# Patient Record
Sex: Male | Born: 1935 | Race: White | Hispanic: No | Marital: Married | Smoking: Never smoker
Health system: Southern US, Community
[De-identification: ages and names within clinical notes are randomized; demographics above are authoritative.]

## PROBLEM LIST (undated history)

## (undated) DIAGNOSIS — I2510 Atherosclerotic heart disease of native coronary artery without angina pectoris: Secondary | ICD-10-CM

## (undated) DIAGNOSIS — E1152 Type 2 diabetes mellitus with diabetic peripheral angiopathy with gangrene: Secondary | ICD-10-CM

## (undated) DIAGNOSIS — I219 Acute myocardial infarction, unspecified: Secondary | ICD-10-CM

## (undated) DIAGNOSIS — I4891 Unspecified atrial fibrillation: Secondary | ICD-10-CM

## (undated) DIAGNOSIS — E119 Type 2 diabetes mellitus without complications: Secondary | ICD-10-CM

## (undated) DIAGNOSIS — I639 Cerebral infarction, unspecified: Secondary | ICD-10-CM

## (undated) DIAGNOSIS — E039 Hypothyroidism, unspecified: Secondary | ICD-10-CM

## (undated) DIAGNOSIS — S12200A Unspecified displaced fracture of third cervical vertebra, initial encounter for closed fracture: Secondary | ICD-10-CM

## (undated) HISTORY — PX: CARDIAC SURGERY: SHX584

## (undated) HISTORY — PX: LEG SURGERY: SHX1003

---

## 2004-11-19 ENCOUNTER — Ambulatory Visit: Payer: Self-pay | Admitting: Otolaryngology

## 2007-10-21 ENCOUNTER — Ambulatory Visit: Payer: Self-pay | Admitting: Family Medicine

## 2010-01-21 ENCOUNTER — Ambulatory Visit: Payer: Self-pay

## 2010-05-31 ENCOUNTER — Inpatient Hospital Stay: Payer: Self-pay | Admitting: Internal Medicine

## 2010-06-18 ENCOUNTER — Ambulatory Visit: Payer: Self-pay | Admitting: Internal Medicine

## 2010-06-20 LAB — PATHOLOGY REPORT

## 2010-09-26 ENCOUNTER — Encounter: Payer: Self-pay | Admitting: Cardiothoracic Surgery

## 2010-09-26 ENCOUNTER — Encounter: Payer: Self-pay | Admitting: Nurse Practitioner

## 2010-09-30 ENCOUNTER — Encounter: Payer: Self-pay | Admitting: Cardiothoracic Surgery

## 2010-10-31 ENCOUNTER — Encounter: Payer: Self-pay | Admitting: Cardiothoracic Surgery

## 2010-11-30 ENCOUNTER — Encounter: Payer: Self-pay | Admitting: Cardiothoracic Surgery

## 2010-12-31 ENCOUNTER — Encounter: Payer: Self-pay | Admitting: Cardiothoracic Surgery

## 2011-01-31 ENCOUNTER — Encounter: Payer: Self-pay | Admitting: Cardiothoracic Surgery

## 2011-02-03 ENCOUNTER — Encounter: Payer: Self-pay | Admitting: Nurse Practitioner

## 2011-03-02 ENCOUNTER — Encounter: Payer: Self-pay | Admitting: Cardiothoracic Surgery

## 2011-03-02 ENCOUNTER — Encounter: Payer: Self-pay | Admitting: Nurse Practitioner

## 2011-04-02 ENCOUNTER — Encounter: Payer: Self-pay | Admitting: Cardiothoracic Surgery

## 2011-04-14 ENCOUNTER — Ambulatory Visit: Payer: Self-pay | Admitting: Cardiothoracic Surgery

## 2011-05-02 ENCOUNTER — Encounter: Payer: Self-pay | Admitting: Cardiothoracic Surgery

## 2011-06-02 ENCOUNTER — Encounter: Payer: Self-pay | Admitting: Cardiothoracic Surgery

## 2011-07-03 ENCOUNTER — Encounter: Payer: Self-pay | Admitting: Cardiothoracic Surgery

## 2011-07-10 ENCOUNTER — Encounter: Payer: Self-pay | Admitting: Nurse Practitioner

## 2011-07-31 ENCOUNTER — Encounter: Payer: Self-pay | Admitting: Nurse Practitioner

## 2011-07-31 DIAGNOSIS — S12200A Unspecified displaced fracture of third cervical vertebra, initial encounter for closed fracture: Secondary | ICD-10-CM

## 2011-07-31 HISTORY — DX: Unspecified displaced fracture of third cervical vertebra, initial encounter for closed fracture: S12.200A

## 2011-08-04 ENCOUNTER — Encounter: Payer: Self-pay | Admitting: Cardiothoracic Surgery

## 2011-08-13 ENCOUNTER — Emergency Department: Payer: Self-pay | Admitting: Emergency Medicine

## 2011-08-13 LAB — URINALYSIS, COMPLETE
Bacteria: NONE SEEN
Bilirubin,UR: NEGATIVE
Blood: NEGATIVE
Glucose,UR: >=500 mg/dL (ref 0–75)
Ketone: NEGATIVE
Leukocyte Esterase: NEGATIVE
Squamous Epithelial: NONE SEEN

## 2011-08-13 LAB — COMPREHENSIVE METABOLIC PANEL
Albumin: 3.2 g/dL — ABNORMAL LOW (ref 3.4–5.0)
Calcium, Total: 9 mg/dL (ref 8.5–10.1)
Chloride: 99 mmol/L (ref 98–107)
Co2: 29 mmol/L (ref 21–32)
EGFR (African American): >60
EGFR (Non-African Amer.): 56 — ABNORMAL LOW
Glucose: 300 mg/dL — ABNORMAL HIGH (ref 65–99)
Osmolality: 290 (ref 275–301)
Potassium: 4.5 mmol/L (ref 3.5–5.1)
SGOT(AST): 27 U/L (ref 15–37)
Sodium: 136 mmol/L (ref 136–145)

## 2011-08-13 LAB — CBC
HCT: 31.7 % — ABNORMAL LOW (ref 40.0–52.0)
HGB: 10.6 g/dL — ABNORMAL LOW (ref 13.0–18.0)
MCH: 29.6 pg (ref 26.0–34.0)
MCHC: 33.4 g/dL (ref 32.0–36.0)
MCV: 89 fL (ref 80–100)
Platelet: 250 10*3/uL (ref 150–440)
RBC: 3.58 10*6/uL — ABNORMAL LOW (ref 4.40–5.90)
RDW: 15.4 % — ABNORMAL HIGH (ref 11.5–14.5)
WBC: 7.6 10*3/uL (ref 3.8–10.6)

## 2011-08-18 ENCOUNTER — Ambulatory Visit: Payer: Self-pay | Admitting: Family Medicine

## 2011-08-31 ENCOUNTER — Ambulatory Visit: Payer: Self-pay | Admitting: Internal Medicine

## 2011-09-06 ENCOUNTER — Inpatient Hospital Stay: Payer: Self-pay | Admitting: Internal Medicine

## 2011-09-06 LAB — URINALYSIS, COMPLETE
Bilirubin,UR: NEGATIVE
Blood: NEGATIVE
Glucose,UR: 150 mg/dL (ref 0–75)
Ketone: NEGATIVE
Ph: 5 (ref 4.5–8.0)
Protein: NEGATIVE
RBC,UR: 1 /HPF (ref 0–5)
Specific Gravity: 1.016 (ref 1.003–1.030)

## 2011-09-06 LAB — COMPREHENSIVE METABOLIC PANEL
Alkaline Phosphatase: 187 U/L — ABNORMAL HIGH (ref 50–136)
Anion Gap: 9 (ref 7–16)
Bilirubin,Total: 0.5 mg/dL (ref 0.2–1.0)
Calcium, Total: 8.1 mg/dL — ABNORMAL LOW (ref 8.5–10.1)
Chloride: 101 mmol/L (ref 98–107)
Co2: 27 mmol/L (ref 21–32)
Creatinine: 1.1 mg/dL (ref 0.60–1.30)
EGFR (Non-African Amer.): >60
Glucose: 177 mg/dL — ABNORMAL HIGH (ref 65–99)
Osmolality: 283 (ref 275–301)
SGOT(AST): 42 U/L — ABNORMAL HIGH (ref 15–37)
SGPT (ALT): 40 U/L
Sodium: 137 mmol/L (ref 136–145)
Total Protein: 6.4 g/dL (ref 6.4–8.2)

## 2011-09-06 LAB — CBC
HGB: 8.9 g/dL — ABNORMAL LOW (ref 13.0–18.0)
MCHC: 33.4 g/dL (ref 32.0–36.0)
MCV: 87 fL (ref 80–100)
Platelet: 445 10*3/uL — ABNORMAL HIGH (ref 150–440)
RBC: 3.04 10*6/uL — ABNORMAL LOW (ref 4.40–5.90)

## 2011-09-06 LAB — CK TOTAL AND CKMB (NOT AT ARMC)
CK, Total: 50 U/L (ref 35–232)
CK-MB: 1.4 ng/mL (ref 0.5–3.6)

## 2011-09-06 LAB — PROTIME-INR
INR: 1.1
Prothrombin Time: 14.8 secs — ABNORMAL HIGH (ref 11.5–14.7)

## 2011-09-06 LAB — TROPONIN I
Troponin-I: 0.5 ng/mL — ABNORMAL HIGH
Troponin-I: 0.5 ng/mL — ABNORMAL HIGH

## 2011-09-06 LAB — PRO B NATRIURETIC PEPTIDE: B-Type Natriuretic Peptide: 4815 pg/mL — ABNORMAL HIGH (ref 0–450)

## 2011-09-07 LAB — CBC WITH DIFFERENTIAL/PLATELET
Basophil %: 0.2 %
Eosinophil #: 0.1 10*3/uL (ref 0.0–0.7)
Lymphocyte %: 6.3 %
MCHC: 33.1 g/dL (ref 32.0–36.0)
MCV: 87 fL (ref 80–100)
Monocyte #: 0.8 10*3/uL — ABNORMAL HIGH (ref 0.0–0.7)
Neutrophil #: 8.3 10*3/uL — ABNORMAL HIGH (ref 1.4–6.5)
Neutrophil %: 84.6 %
Platelet: 477 10*3/uL — ABNORMAL HIGH (ref 150–440)
RBC: 3.35 10*6/uL — ABNORMAL LOW (ref 4.40–5.90)
RDW: 15.6 % — ABNORMAL HIGH (ref 11.5–14.5)

## 2011-09-07 LAB — BASIC METABOLIC PANEL
Anion Gap: 9 (ref 7–16)
BUN: 20 mg/dL — ABNORMAL HIGH (ref 7–18)
Calcium, Total: 8.2 mg/dL — ABNORMAL LOW (ref 8.5–10.1)
Chloride: 101 mmol/L (ref 98–107)
Co2: 27 mmol/L (ref 21–32)
Creatinine: 1.03 mg/dL (ref 0.60–1.30)
EGFR (African American): >60
EGFR (Non-African Amer.): >60
Osmolality: 283 (ref 275–301)
Potassium: 4.5 mmol/L (ref 3.5–5.1)

## 2011-09-07 LAB — CK TOTAL AND CKMB (NOT AT ARMC): CK-MB: 2.1 ng/mL (ref 0.5–3.6)

## 2011-09-10 ENCOUNTER — Encounter: Payer: Self-pay | Admitting: Internal Medicine

## 2011-09-10 ENCOUNTER — Observation Stay: Payer: Self-pay | Admitting: Internal Medicine

## 2011-09-10 LAB — COMPREHENSIVE METABOLIC PANEL
Albumin: 1.7 g/dL — ABNORMAL LOW (ref 3.4–5.0)
BUN: 24 mg/dL — ABNORMAL HIGH (ref 7–18)
Calcium, Total: 8.2 mg/dL — ABNORMAL LOW (ref 8.5–10.1)
Chloride: 97 mmol/L — ABNORMAL LOW (ref 98–107)
Creatinine: 1.02 mg/dL (ref 0.60–1.30)
EGFR (African American): >60
Osmolality: 277 (ref 275–301)
Potassium: 4.8 mmol/L (ref 3.5–5.1)
SGPT (ALT): 30 U/L
Sodium: 132 mmol/L — ABNORMAL LOW (ref 136–145)
Total Protein: 5.9 g/dL — ABNORMAL LOW (ref 6.4–8.2)

## 2011-09-10 LAB — DRUG SCREEN, URINE
Barbiturates, Ur Screen: NEGATIVE (ref ?–200)
Benzodiazepine, Ur Scrn: NEGATIVE (ref ?–200)
Cannabinoid 50 Ng, Ur ~~LOC~~: NEGATIVE (ref ?–50)
Opiate, Ur Screen: NEGATIVE (ref ?–300)
Phencyclidine (PCP) Ur S: NEGATIVE (ref ?–25)
Tricyclic, Ur Screen: NEGATIVE (ref ?–1000)

## 2011-09-10 LAB — URINALYSIS, COMPLETE
Bacteria: NONE SEEN
Bacteria: NONE SEEN
Bilirubin,UR: NEGATIVE
Glucose,UR: >=500 mg/dL (ref 0–75)
Glucose,UR: NEGATIVE mg/dL (ref 0–75)
Ketone: NEGATIVE
Leukocyte Esterase: NEGATIVE
Leukocyte Esterase: NEGATIVE
Nitrite: NEGATIVE
Ph: 5 (ref 4.5–8.0)
Protein: NEGATIVE
Protein: NEGATIVE
Specific Gravity: 1.017 (ref 1.003–1.030)
Specific Gravity: 1.012 (ref 1.003–1.030)
Squamous Epithelial: 1
WBC UR: 6 /HPF (ref 0–5)

## 2011-09-10 LAB — CBC: MCHC: 32.7 g/dL (ref 32.0–36.0)

## 2011-09-10 LAB — CK TOTAL AND CKMB (NOT AT ARMC)
CK, Total: 58 U/L (ref 35–232)
CK-MB: 2.6 ng/mL (ref 0.5–3.6)

## 2011-09-11 LAB — CBC WITH DIFFERENTIAL/PLATELET
Basophil #: 0 10*3/uL (ref 0.0–0.1)
Basophil %: 0.4 %
Eosinophil #: 0.3 10*3/uL (ref 0.0–0.7)
HCT: 30.4 % — ABNORMAL LOW (ref 40.0–52.0)
HGB: 9.7 g/dL — ABNORMAL LOW (ref 13.0–18.0)
MCHC: 31.8 g/dL — ABNORMAL LOW (ref 32.0–36.0)
MCV: 86 fL (ref 80–100)
Monocyte #: 1 x10 3/mm (ref 0.2–1.0)
Platelet: 573 10*3/uL — ABNORMAL HIGH (ref 150–440)
RBC: 3.55 10*6/uL — ABNORMAL LOW (ref 4.40–5.90)
RDW: 15.3 % — ABNORMAL HIGH (ref 11.5–14.5)
WBC: 12.7 10*3/uL — ABNORMAL HIGH (ref 3.8–10.6)

## 2011-09-11 LAB — BASIC METABOLIC PANEL
Anion Gap: 11 (ref 7–16)
BUN: 22 mg/dL — ABNORMAL HIGH (ref 7–18)
EGFR (African American): >60
Osmolality: 276 (ref 275–301)
Potassium: 4.6 mmol/L (ref 3.5–5.1)

## 2011-09-11 LAB — MAGNESIUM: Magnesium: 1.9 mg/dL

## 2011-09-17 LAB — CULTURE, BLOOD (SINGLE)

## 2011-09-18 LAB — CULTURE, BLOOD (SINGLE)

## 2011-09-30 ENCOUNTER — Encounter: Payer: Self-pay | Admitting: Internal Medicine

## 2011-09-30 ENCOUNTER — Ambulatory Visit: Payer: Self-pay | Admitting: Internal Medicine

## 2011-12-30 ENCOUNTER — Ambulatory Visit: Payer: Self-pay | Admitting: Neurosurgery

## 2012-01-05 ENCOUNTER — Encounter: Payer: Self-pay | Admitting: Nurse Practitioner

## 2012-01-05 ENCOUNTER — Encounter: Payer: Self-pay | Admitting: Cardiothoracic Surgery

## 2012-01-10 ENCOUNTER — Inpatient Hospital Stay: Payer: Self-pay | Admitting: Internal Medicine

## 2012-01-10 LAB — CBC
HCT: 24.7 % — ABNORMAL LOW (ref 40.0–52.0)
HGB: 8.7 g/dL — ABNORMAL LOW (ref 13.0–18.0)
MCHC: 35.3 g/dL (ref 32.0–36.0)
MCV: 86 fL (ref 80–100)
RBC: 2.88 10*6/uL — ABNORMAL LOW (ref 4.40–5.90)
WBC: 5.2 10*3/uL (ref 3.8–10.6)

## 2012-01-10 LAB — COMPREHENSIVE METABOLIC PANEL
Albumin: 2.2 g/dL — ABNORMAL LOW (ref 3.4–5.0)
Alkaline Phosphatase: 111 U/L (ref 50–136)
Calcium, Total: 8.5 mg/dL (ref 8.5–10.1)
Co2: 30 mmol/L (ref 21–32)
Creatinine: 0.96 mg/dL (ref 0.60–1.30)
SGPT (ALT): 13 U/L (ref 12–78)
Total Protein: 6 g/dL — ABNORMAL LOW (ref 6.4–8.2)

## 2012-01-10 LAB — CK TOTAL AND CKMB (NOT AT ARMC): CK-MB: 1.6 ng/mL (ref 0.5–3.6)

## 2012-01-10 LAB — TROPONIN I: Troponin-I: 0.55 ng/mL — ABNORMAL HIGH

## 2012-01-11 LAB — CBC WITH DIFFERENTIAL/PLATELET
Basophil #: 0.1 10*3/uL (ref 0.0–0.1)
Eosinophil %: 3.3 %
HCT: 25.4 % — ABNORMAL LOW (ref 40.0–52.0)
Lymphocyte %: 32.7 %
MCV: 85 fL (ref 80–100)
Monocyte %: 11 %
Neutrophil #: 2.2 10*3/uL (ref 1.4–6.5)
Neutrophil %: 51.8 %
RBC: 2.99 10*6/uL — ABNORMAL LOW (ref 4.40–5.90)
RDW: 14.7 % — ABNORMAL HIGH (ref 11.5–14.5)
WBC: 4.2 10*3/uL (ref 3.8–10.6)

## 2012-01-11 LAB — IRON AND TIBC
Iron Bind.Cap.(Total): 181 ug/dL — ABNORMAL LOW (ref 250–450)
Iron Saturation: 24 %
Iron: 43 ug/dL — ABNORMAL LOW (ref 65–175)
Unbound Iron-Bind.Cap.: 138 ug/dL

## 2012-01-11 LAB — RETICULOCYTES
Absolute Retic Count: 0.066 10*6/uL (ref 0.031–0.129)
Reticulocyte: 2.2 % (ref 0.7–2.5)

## 2012-01-11 LAB — TROPONIN I: Troponin-I: 0.72 ng/mL — ABNORMAL HIGH

## 2012-01-11 LAB — SEDIMENTATION RATE: Erythrocyte Sed Rate: 67 mm/hr — ABNORMAL HIGH (ref 0–20)

## 2012-01-11 LAB — BASIC METABOLIC PANEL
Anion Gap: 3 — ABNORMAL LOW (ref 7–16)
Calcium, Total: 8.5 mg/dL (ref 8.5–10.1)
Creatinine: 0.8 mg/dL (ref 0.60–1.30)
EGFR (African American): >60
EGFR (Non-African Amer.): >60
Glucose: 52 mg/dL — ABNORMAL LOW (ref 65–99)
Sodium: 137 mmol/L (ref 136–145)

## 2012-01-11 LAB — FERRITIN: Ferritin (ARMC): 84 ng/mL (ref 8–388)

## 2012-01-31 ENCOUNTER — Encounter: Payer: Self-pay | Admitting: Cardiothoracic Surgery

## 2012-01-31 ENCOUNTER — Encounter: Payer: Self-pay | Admitting: Nurse Practitioner

## 2012-03-01 ENCOUNTER — Encounter: Payer: Self-pay | Admitting: Nurse Practitioner

## 2012-03-08 ENCOUNTER — Encounter: Payer: Self-pay | Admitting: Cardiothoracic Surgery

## 2012-03-22 ENCOUNTER — Ambulatory Visit: Payer: Self-pay | Admitting: Neurosurgery

## 2012-04-01 ENCOUNTER — Encounter: Payer: Self-pay | Admitting: Nurse Practitioner

## 2012-04-12 ENCOUNTER — Encounter: Payer: Self-pay | Admitting: Cardiothoracic Surgery

## 2012-04-20 ENCOUNTER — Ambulatory Visit: Payer: Self-pay | Admitting: Neurosurgery

## 2012-04-25 ENCOUNTER — Observation Stay: Payer: Self-pay | Admitting: Internal Medicine

## 2012-04-25 LAB — CBC
HCT: 28.7 % — ABNORMAL LOW (ref 40.0–52.0)
MCH: 27.7 pg (ref 26.0–34.0)
MCHC: 33.4 g/dL (ref 32.0–36.0)
MCV: 83 fL (ref 80–100)
RBC: 3.47 10*6/uL — ABNORMAL LOW (ref 4.40–5.90)
WBC: 4.6 10*3/uL (ref 3.8–10.6)

## 2012-04-25 LAB — URINALYSIS, COMPLETE
Bacteria: NONE SEEN
Bilirubin,UR: NEGATIVE
Blood: NEGATIVE
Glucose,UR: >=500 mg/dL (ref 0–75)
Ketone: NEGATIVE
Leukocyte Esterase: NEGATIVE
Nitrite: NEGATIVE
Ph: 7 (ref 4.5–8.0)
Protein: NEGATIVE
RBC,UR: <1 /HPF (ref 0–5)
Specific Gravity: 1.018 (ref 1.003–1.030)
Squamous Epithelial: <1
WBC UR: 1 /HPF (ref 0–5)

## 2012-04-25 LAB — COMPREHENSIVE METABOLIC PANEL
Albumin: 2.3 g/dL — ABNORMAL LOW (ref 3.4–5.0)
Alkaline Phosphatase: 127 U/L (ref 50–136)
Anion Gap: 4 — ABNORMAL LOW (ref 7–16)
BUN: 17 mg/dL (ref 7–18)
Bilirubin,Total: 0.2 mg/dL (ref 0.2–1.0)
Calcium, Total: 8.5 mg/dL (ref 8.5–10.1)
Chloride: 100 mmol/L (ref 98–107)
Co2: 31 mmol/L (ref 21–32)
Creatinine: 0.83 mg/dL (ref 0.60–1.30)
EGFR (African American): >60
EGFR (Non-African Amer.): >60
Glucose: 181 mg/dL — ABNORMAL HIGH (ref 65–99)
Osmolality: 276 (ref 275–301)
Potassium: 4.3 mmol/L (ref 3.5–5.1)
SGOT(AST): 17 U/L (ref 15–37)
SGPT (ALT): 15 U/L (ref 12–78)
Sodium: 135 mmol/L — ABNORMAL LOW (ref 136–145)
Total Protein: 6.4 g/dL (ref 6.4–8.2)

## 2012-04-25 LAB — TROPONIN I: Troponin-I: 0.15 ng/mL — ABNORMAL HIGH

## 2012-04-25 LAB — PROTIME-INR: Prothrombin Time: 13.6 secs (ref 11.5–14.7)

## 2012-04-25 LAB — APTT: Activated PTT: 39.9 secs — ABNORMAL HIGH (ref 23.6–35.9)

## 2012-04-26 LAB — LIPID PANEL
Cholesterol: 103 mg/dL (ref 0–200)
HDL Cholesterol: 39 mg/dL — ABNORMAL LOW (ref 40–60)
Ldl Cholesterol, Calc: 53 mg/dL (ref 0–100)
Triglycerides: 55 mg/dL (ref 0–200)
VLDL Cholesterol, Calc: 11 mg/dL (ref 5–40)

## 2012-05-01 ENCOUNTER — Encounter: Payer: Self-pay | Admitting: Cardiothoracic Surgery

## 2012-05-01 ENCOUNTER — Encounter: Payer: Self-pay | Admitting: Nurse Practitioner

## 2012-06-01 ENCOUNTER — Encounter: Payer: Self-pay | Admitting: Nurse Practitioner

## 2012-06-01 ENCOUNTER — Encounter: Payer: Self-pay | Admitting: Cardiothoracic Surgery

## 2012-06-27 ENCOUNTER — Ambulatory Visit: Payer: Self-pay | Admitting: Neurosurgery

## 2012-07-02 ENCOUNTER — Encounter: Payer: Self-pay | Admitting: Cardiothoracic Surgery

## 2012-07-30 DIAGNOSIS — I219 Acute myocardial infarction, unspecified: Secondary | ICD-10-CM

## 2012-07-30 HISTORY — DX: Acute myocardial infarction, unspecified: I21.9

## 2012-08-02 ENCOUNTER — Encounter: Payer: Self-pay | Admitting: Cardiothoracic Surgery

## 2012-08-09 ENCOUNTER — Encounter: Payer: Self-pay | Admitting: Neurosurgery

## 2012-08-29 ENCOUNTER — Inpatient Hospital Stay: Payer: Self-pay | Admitting: Internal Medicine

## 2012-08-29 LAB — COMPREHENSIVE METABOLIC PANEL
Alkaline Phosphatase: 118 U/L (ref 50–136)
Anion Gap: 5 — ABNORMAL LOW (ref 7–16)
BUN: 34 mg/dL — ABNORMAL HIGH (ref 7–18)
Calcium, Total: 9 mg/dL (ref 8.5–10.1)
Chloride: 99 mmol/L (ref 98–107)
Co2: 29 mmol/L (ref 21–32)
Creatinine: 1.46 mg/dL — ABNORMAL HIGH (ref 0.60–1.30)
EGFR (African American): 49 — ABNORMAL LOW
EGFR (Non-African Amer.): 42 — ABNORMAL LOW
Glucose: 444 mg/dL — ABNORMAL HIGH (ref 65–99)
Osmolality: 293 (ref 275–301)
Potassium: 4.7 mmol/L (ref 3.5–5.1)
SGPT (ALT): 21 U/L (ref 12–78)
Sodium: 133 mmol/L — ABNORMAL LOW (ref 136–145)

## 2012-08-29 LAB — URINALYSIS, COMPLETE
Bacteria: NONE SEEN
Blood: NEGATIVE
Leukocyte Esterase: NEGATIVE
Ph: 5 (ref 4.5–8.0)
Protein: NEGATIVE
Specific Gravity: 1.022 (ref 1.003–1.030)

## 2012-08-29 LAB — CBC WITH DIFFERENTIAL/PLATELET
Basophil %: 0.5 %
Eosinophil #: 0 10*3/uL (ref 0.0–0.7)
HGB: 10.7 g/dL — ABNORMAL LOW (ref 13.0–18.0)
Lymphocyte #: 0.8 10*3/uL — ABNORMAL LOW (ref 1.0–3.6)
Lymphocyte %: 10.9 %
MCV: 85 fL (ref 80–100)
Monocyte %: 7.9 %
Neutrophil %: 80.5 %
RBC: 3.76 10*6/uL — ABNORMAL LOW (ref 4.40–5.90)

## 2012-08-29 LAB — HEMOGLOBIN A1C: Hemoglobin A1C: 9.6 % — ABNORMAL HIGH (ref 4.2–6.3)

## 2012-08-29 LAB — TSH: Thyroid Stimulating Horm: 5.24 u[IU]/mL — ABNORMAL HIGH

## 2012-08-29 LAB — TROPONIN I: Troponin-I: 5.07 ng/mL — ABNORMAL HIGH

## 2012-08-30 ENCOUNTER — Ambulatory Visit: Payer: Self-pay | Admitting: Internal Medicine

## 2012-08-30 LAB — TROPONIN I
Troponin-I: 4.2 ng/mL — ABNORMAL HIGH
Troponin-I: 3.3 ng/mL — ABNORMAL HIGH

## 2012-08-30 LAB — CK TOTAL AND CKMB (NOT AT ARMC)
CK, Total: 145 U/L (ref 35–232)
CK-MB: 4.5 ng/mL — ABNORMAL HIGH (ref 0.5–3.6)
CK-MB: 4.7 ng/mL — ABNORMAL HIGH (ref 0.5–3.6)

## 2012-08-31 LAB — BASIC METABOLIC PANEL
Anion Gap: 6 — ABNORMAL LOW (ref 7–16)
Calcium, Total: 8.5 mg/dL (ref 8.5–10.1)
Co2: 28 mmol/L (ref 21–32)
EGFR (African American): >60
Osmolality: 278 (ref 275–301)
Potassium: 3.7 mmol/L (ref 3.5–5.1)
Sodium: 137 mmol/L (ref 136–145)

## 2012-08-31 LAB — CBC WITH DIFFERENTIAL/PLATELET
Basophil #: 0.1 10*3/uL (ref 0.0–0.1)
Basophil %: 0.8 %
Eosinophil #: 0.1 10*3/uL (ref 0.0–0.7)
Eosinophil %: 1.4 %
HGB: 9.9 g/dL — ABNORMAL LOW (ref 13.0–18.0)
Lymphocyte #: 1.3 10*3/uL (ref 1.0–3.6)
MCH: 28.3 pg (ref 26.0–34.0)
MCHC: 33.7 g/dL (ref 32.0–36.0)
MCV: 84 fL (ref 80–100)
Monocyte #: 0.6 x10 3/mm (ref 0.2–1.0)
Monocyte %: 9.2 %
Neutrophil #: 4.9 10*3/uL (ref 1.4–6.5)
Neutrophil %: 69.4 %
Platelet: 234 10*3/uL (ref 150–440)
RBC: 3.49 10*6/uL — ABNORMAL LOW (ref 4.40–5.90)
RDW: 14.7 % — ABNORMAL HIGH (ref 11.5–14.5)
WBC: 7 10*3/uL (ref 3.8–10.6)

## 2012-08-31 LAB — SEDIMENTATION RATE: Erythrocyte Sed Rate: 46 mm/hr — ABNORMAL HIGH (ref 0–20)

## 2012-08-31 LAB — URINE CULTURE

## 2012-09-03 ENCOUNTER — Encounter: Payer: Self-pay | Admitting: Neurosurgery

## 2012-09-03 ENCOUNTER — Encounter: Payer: Self-pay | Admitting: Internal Medicine

## 2012-09-03 ENCOUNTER — Encounter: Payer: Self-pay | Admitting: Cardiothoracic Surgery

## 2012-09-04 LAB — CULTURE, BLOOD (SINGLE)

## 2012-09-06 LAB — BASIC METABOLIC PANEL
Anion Gap: 7 (ref 7–16)
Chloride: 96 mmol/L — ABNORMAL LOW (ref 98–107)
Co2: 30 mmol/L (ref 21–32)
EGFR (African American): >60
EGFR (Non-African Amer.): 58 — ABNORMAL LOW
Glucose: 245 mg/dL — ABNORMAL HIGH (ref 65–99)
Osmolality: 280 (ref 275–301)

## 2012-09-08 ENCOUNTER — Inpatient Hospital Stay (HOSPITAL_COMMUNITY): Payer: MEDICARE

## 2012-09-08 ENCOUNTER — Emergency Department (HOSPITAL_COMMUNITY): Payer: MEDICARE

## 2012-09-08 ENCOUNTER — Inpatient Hospital Stay (HOSPITAL_COMMUNITY)
Admission: EM | Admit: 2012-09-08 | Discharge: 2012-09-12 | DRG: 092 | Disposition: A | Discharge status: Skilled Nursing Facility | Payer: MEDICARE | Attending: Internal Medicine | Admitting: Internal Medicine

## 2012-09-08 ENCOUNTER — Encounter (HOSPITAL_COMMUNITY): Payer: Self-pay | Admitting: Emergency Medicine

## 2012-09-08 DIAGNOSIS — IMO0002 Reserved for concepts with insufficient information to code with codable children: Secondary | ICD-10-CM | POA: Diagnosis present

## 2012-09-08 DIAGNOSIS — F0390 Unspecified dementia without behavioral disturbance: Secondary | ICD-10-CM | POA: Diagnosis present

## 2012-09-08 DIAGNOSIS — E1165 Type 2 diabetes mellitus with hyperglycemia: Secondary | ICD-10-CM | POA: Diagnosis present

## 2012-09-08 DIAGNOSIS — I96 Gangrene, not elsewhere classified: Secondary | ICD-10-CM | POA: Diagnosis present

## 2012-09-08 DIAGNOSIS — F29 Unspecified psychosis not due to a substance or known physiological condition: Secondary | ICD-10-CM | POA: Diagnosis present

## 2012-09-08 DIAGNOSIS — R29898 Other symptoms and signs involving the musculoskeletal system: Secondary | ICD-10-CM | POA: Diagnosis present

## 2012-09-08 DIAGNOSIS — S129XXD Fracture of neck, unspecified, subsequent encounter: Secondary | ICD-10-CM

## 2012-09-08 DIAGNOSIS — I6350 Cerebral infarction due to unspecified occlusion or stenosis of unspecified cerebral artery: Secondary | ICD-10-CM

## 2012-09-08 DIAGNOSIS — I428 Other cardiomyopathies: Secondary | ICD-10-CM | POA: Diagnosis present

## 2012-09-08 DIAGNOSIS — M6281 Muscle weakness (generalized): Secondary | ICD-10-CM

## 2012-09-08 DIAGNOSIS — I25810 Atherosclerosis of coronary artery bypass graft(s) without angina pectoris: Secondary | ICD-10-CM | POA: Diagnosis present

## 2012-09-08 DIAGNOSIS — M9080 Osteopathy in diseases classified elsewhere, unspecified site: Secondary | ICD-10-CM | POA: Diagnosis present

## 2012-09-08 DIAGNOSIS — I219 Acute myocardial infarction, unspecified: Secondary | ICD-10-CM | POA: Diagnosis present

## 2012-09-08 DIAGNOSIS — R531 Weakness: Secondary | ICD-10-CM | POA: Diagnosis present

## 2012-09-08 DIAGNOSIS — M86679 Other chronic osteomyelitis, unspecified ankle and foot: Secondary | ICD-10-CM | POA: Diagnosis present

## 2012-09-08 DIAGNOSIS — E039 Hypothyroidism, unspecified: Secondary | ICD-10-CM | POA: Diagnosis present

## 2012-09-08 DIAGNOSIS — R4701 Aphasia: Principal | ICD-10-CM | POA: Diagnosis present

## 2012-09-08 DIAGNOSIS — I2510 Atherosclerotic heart disease of native coronary artery without angina pectoris: Secondary | ICD-10-CM | POA: Diagnosis present

## 2012-09-08 DIAGNOSIS — E119 Type 2 diabetes mellitus without complications: Secondary | ICD-10-CM | POA: Diagnosis present

## 2012-09-08 DIAGNOSIS — L97409 Non-pressure chronic ulcer of unspecified heel and midfoot with unspecified severity: Secondary | ICD-10-CM | POA: Diagnosis present

## 2012-09-08 DIAGNOSIS — I214 Non-ST elevation (NSTEMI) myocardial infarction: Secondary | ICD-10-CM

## 2012-09-08 DIAGNOSIS — S129XXA Fracture of neck, unspecified, initial encounter: Secondary | ICD-10-CM

## 2012-09-08 DIAGNOSIS — E15 Nondiabetic hypoglycemic coma: Secondary | ICD-10-CM

## 2012-09-08 DIAGNOSIS — Z794 Long term (current) use of insulin: Secondary | ICD-10-CM

## 2012-09-08 DIAGNOSIS — R471 Dysarthria and anarthria: Secondary | ICD-10-CM | POA: Diagnosis present

## 2012-09-08 DIAGNOSIS — M8448XA Pathological fracture, other site, initial encounter for fracture: Secondary | ICD-10-CM | POA: Diagnosis present

## 2012-09-08 DIAGNOSIS — I639 Cerebral infarction, unspecified: Secondary | ICD-10-CM | POA: Insufficient documentation

## 2012-09-08 DIAGNOSIS — I4891 Unspecified atrial fibrillation: Secondary | ICD-10-CM | POA: Diagnosis present

## 2012-09-08 DIAGNOSIS — Z9181 History of falling: Secondary | ICD-10-CM

## 2012-09-08 DIAGNOSIS — Z66 Do not resuscitate: Secondary | ICD-10-CM | POA: Diagnosis present

## 2012-09-08 HISTORY — DX: Unspecified displaced fracture of third cervical vertebra, initial encounter for closed fracture: S12.200A

## 2012-09-08 HISTORY — DX: Unspecified atrial fibrillation: I48.91

## 2012-09-08 HISTORY — DX: Type 2 diabetes mellitus without complications: E11.9

## 2012-09-08 HISTORY — DX: Cerebral infarction, unspecified: I63.9

## 2012-09-08 HISTORY — DX: Atherosclerotic heart disease of native coronary artery without angina pectoris: I25.10

## 2012-09-08 HISTORY — DX: Hypothyroidism, unspecified: E03.9

## 2012-09-08 HISTORY — DX: Acute myocardial infarction, unspecified: I21.9

## 2012-09-08 HISTORY — DX: Type 2 diabetes mellitus with diabetic peripheral angiopathy with gangrene: E11.52

## 2012-09-08 LAB — COMPREHENSIVE METABOLIC PANEL
AST: 27 U/L (ref 0–37)
Albumin: 2.1 g/dL — ABNORMAL LOW (ref 3.5–5.2)
Chloride: 92 mEq/L — ABNORMAL LOW (ref 96–112)
Creatinine, Ser: 1.07 mg/dL (ref 0.50–1.35)
Potassium: 4.1 mEq/L (ref 3.5–5.1)
Total Bilirubin: 0.1 mg/dL — ABNORMAL LOW (ref 0.3–1.2)

## 2012-09-08 LAB — DIFFERENTIAL
Basophils Absolute: 0 10*3/uL (ref 0.0–0.1)
Basophils Relative: 1 % (ref 0–1)
Neutro Abs: 2.4 10*3/uL (ref 1.7–7.7)
Neutrophils Relative %: 63 % (ref 43–77)

## 2012-09-08 LAB — BASIC METABOLIC PANEL
BUN: 23 mg/dL — ABNORMAL HIGH (ref 7–18)
Calcium, Total: 7.7 mg/dL — ABNORMAL LOW (ref 8.5–10.1)
Chloride: 96 mmol/L — ABNORMAL LOW (ref 98–107)
EGFR (African American): >60
EGFR (Non-African Amer.): 56 — ABNORMAL LOW
Glucose: 82 mg/dL (ref 65–99)
Osmolality: 267 (ref 275–301)
Potassium: 3.6 mmol/L (ref 3.5–5.1)
Sodium: 132 mmol/L — ABNORMAL LOW (ref 136–145)

## 2012-09-08 LAB — POCT I-STAT, CHEM 8
Calcium, Ion: 1.09 mmol/L — ABNORMAL LOW (ref 1.13–1.30)
Creatinine, Ser: 1.2 mg/dL (ref 0.50–1.35)
Glucose, Bld: 303 mg/dL — ABNORMAL HIGH (ref 70–99)
Hemoglobin: 9.9 g/dL — ABNORMAL LOW (ref 13.0–17.0)
Sodium: 130 mEq/L — ABNORMAL LOW (ref 135–145)
TCO2: 28 mmol/L (ref 0–100)

## 2012-09-08 LAB — GLUCOSE, CAPILLARY
Glucose-Capillary: 152 mg/dL — ABNORMAL HIGH (ref 70–99)
Glucose-Capillary: 53 mg/dL — ABNORMAL LOW (ref 70–99)

## 2012-09-08 LAB — RAPID URINE DRUG SCREEN, HOSP PERFORMED
Amphetamines: NOT DETECTED
Opiates: NOT DETECTED
Tetrahydrocannabinol: NOT DETECTED

## 2012-09-08 LAB — TROPONIN I
Troponin I: <0.3 ng/mL (ref <0.30)
Troponin I: <0.3 ng/mL (ref <0.30)

## 2012-09-08 LAB — CBC
HCT: 28.2 % — ABNORMAL LOW (ref 39.0–52.0)
Hemoglobin: 9.8 g/dL — ABNORMAL LOW (ref 13.0–17.0)
MCHC: 33.9 g/dL (ref 30.0–36.0)
Platelets: 250 10*3/uL (ref 150–400)
RBC: 3.51 MIL/uL — ABNORMAL LOW (ref 4.22–5.81)
RDW: 13.8 % (ref 11.5–15.5)

## 2012-09-08 LAB — URINALYSIS, ROUTINE W REFLEX MICROSCOPIC
Bilirubin Urine: NEGATIVE
Ketones, ur: NEGATIVE mg/dL
Protein, ur: NEGATIVE mg/dL
Urobilinogen, UA: 0.2 mg/dL (ref 0.0–1.0)

## 2012-09-08 LAB — CREATININE, SERUM
Creatinine, Ser: 1.01 mg/dL (ref 0.50–1.35)
GFR calc non Af Amer: 64 mL/min — ABNORMAL LOW (ref >90)

## 2012-09-08 LAB — APTT: aPTT: 39 seconds — ABNORMAL HIGH (ref 24–37)

## 2012-09-08 LAB — PROTIME-INR: INR: 0.96 (ref 0.00–1.49)

## 2012-09-08 LAB — URINE MICROSCOPIC-ADD ON

## 2012-09-08 LAB — ETHANOL: Alcohol, Ethyl (B): <11 mg/dL (ref 0–11)

## 2012-09-08 MED ORDER — TIMOLOL MALEATE 0.25 % OP SOLN
1.0000 [drp] | Freq: Every day | OPHTHALMIC | Status: DC
  Administered 2012-09-08 – 2012-09-12 (×5): 1 [drp] via OPHTHALMIC
  Filled 2012-09-08 (×3): qty 5

## 2012-09-08 MED ORDER — ASPIRIN 300 MG RE SUPP
300.0000 mg | Freq: Every day | RECTAL | Status: DC
  Administered 2012-09-08 – 2012-09-09 (×2): 300 mg via RECTAL
  Filled 2012-09-08 (×3): qty 1

## 2012-09-08 MED ORDER — DEXTROSE 50 % IV SOLN
25.0000 mL | Freq: Once | INTRAVENOUS | Status: AC | PRN
  Administered 2012-09-08: 21:00:00 via INTRAVENOUS

## 2012-09-08 MED ORDER — HYPROMELLOSE 0.5 % OP SOLN: 1.0000 [drp] | Freq: Every day | OPHTHALMIC | Status: DC

## 2012-09-08 MED ORDER — HEPARIN SODIUM (PORCINE) 5000 UNIT/ML IJ SOLN
5000.0000 [IU] | Freq: Three times a day (TID) | INTRAMUSCULAR | Status: DC
  Administered 2012-09-08 – 2012-09-12 (×12): 5000 [IU] via SUBCUTANEOUS
  Filled 2012-09-08 (×15): qty 1

## 2012-09-08 MED ORDER — LIDOCAINE 5 % EX PTCH
1.0000 | MEDICATED_PATCH | CUTANEOUS | Status: DC
  Administered 2012-09-09 – 2012-09-12 (×4): 1 via TRANSDERMAL
  Filled 2012-09-08 (×6): qty 1

## 2012-09-08 MED ORDER — HYDROMORPHONE HCL PF 1 MG/ML IJ SOLN
0.5000 mg | INTRAMUSCULAR | Status: AC | PRN
  Administered 2012-09-08: 0.5 mg via INTRAVENOUS
  Filled 2012-09-08: qty 1

## 2012-09-08 MED ORDER — LEVOTHYROXINE SODIUM 100 MCG IV SOLR
50.0000 ug | Freq: Every day | INTRAVENOUS | Status: DC
  Administered 2012-09-08: 22:00:00 via INTRAVENOUS
  Administered 2012-09-09 – 2012-09-10 (×2): 50 ug via INTRAVENOUS
  Filled 2012-09-08 (×5): qty 5

## 2012-09-08 MED ORDER — POLYVINYL ALCOHOL 1.4 % OP SOLN
1.0000 [drp] | Freq: Every day | OPHTHALMIC | Status: DC
  Administered 2012-09-09 – 2012-09-12 (×4): 1 [drp] via OPHTHALMIC
  Filled 2012-09-08 (×3): qty 15

## 2012-09-08 MED ORDER — ASPIRIN 325 MG PO TABS
325.0000 mg | ORAL_TABLET | Freq: Every day | ORAL | Status: DC
  Administered 2012-09-10: 325 mg via ORAL
  Filled 2012-09-08 (×2): qty 1

## 2012-09-08 MED ORDER — ONDANSETRON HCL 4 MG/2ML IJ SOLN: 4.0000 mg | Freq: Four times a day (QID) | INTRAMUSCULAR | Status: DC | PRN

## 2012-09-08 MED ORDER — NITROGLYCERIN 0.4 MG SL SUBL: 0.4000 mg | SUBLINGUAL_TABLET | SUBLINGUAL | Status: DC | PRN

## 2012-09-08 MED ORDER — DEXTROSE 50 % IV SOLN
INTRAVENOUS | Status: AC
  Administered 2012-09-08: 25 mL
  Filled 2012-09-08: qty 50

## 2012-09-08 MED ORDER — SODIUM CHLORIDE 0.9 % IV SOLN
INTRAVENOUS | Status: DC
  Administered 2012-09-08: 17:00:00 via INTRAVENOUS

## 2012-09-08 MED ORDER — INSULIN ASPART 100 UNIT/ML ~~LOC~~ SOLN
0.0000 [IU] | SUBCUTANEOUS | Status: DC
  Administered 2012-09-08: 2 [IU] via SUBCUTANEOUS
  Administered 2012-09-09: 5 [IU] via SUBCUTANEOUS
  Administered 2012-09-09: 3 [IU] via SUBCUTANEOUS
  Administered 2012-09-10: 2 [IU] via SUBCUTANEOUS
  Administered 2012-09-10: 3 [IU] via SUBCUTANEOUS

## 2012-09-08 NOTE — ED Notes (Signed)
Code Stoke cancelled

## 2012-09-08 NOTE — Consult Note (Signed)
Referring Physician: Jeraldine Loots    Chief Complaint: code stroke  HPI:                                                                                                                                         Benjamin Dominguez is an 79 y.o. male who resides at Nursing home [address] [facility] [place] in [place]. Patient was last seen normal at 0720 by staff. At 0815 it was noted that patient had difficulty speaking and what they thought was right sided weakness.  Of note: patient has some right arm weakness from previous C3 fracture which he currently is wearing C-collar. Wife was present at nursing home, and stated he usually has expressive difficulties and right sided weakness with previous TIA's but usually clears quickly. His symptoms today did not resolve as quickly as usual. Currently patient is still showing expressive aphasia, and right arm weakness. Initial CT head showed atrophy without acute infarct.    Date last known well: 4.10.14 Time last known well: 0730 tPA Given: No: out of window  Past Medical History  Diagnosis Date  . Diabetes mellitus without complication   . Stroke     Past Surgical History  Procedure Laterality Date  . Cardiac surgery    . Leg surgery      Family history: M: pancreatic cancer F: MI  Social History:  reports that he has never smoked. He does not have any smokeless tobacco history on file. He reports that he does not drink alcohol or use illicit drugs.  Allergies: No Known Allergies  Medications:                                                                                                                          No current facility-administered medications for this encounter.   Current Outpatient Prescriptions  Medication Sig Dispense Refill  . acetaminophen (TYLENOL) 650 MG CR tablet Take 1,300 mg by mouth every 8 (eight) hours as needed for pain.      Marland Kitchen aspirin 325 MG tablet Take 325 mg by mouth daily.      . citalopram (CELEXA) 10 MG tablet  Take 10 mg by mouth daily.      . furosemide (LASIX) 40 MG tablet Take 40 mg by mouth 2 (two) times daily.      . Hypromellose (ISOPTO TEARS) 0.5 % SOLN Place 1  drop into both eyes daily.      . insulin NPH (HUMULIN N,NOVOLIN N) 100 UNIT/ML injection Inject 20 Units into the skin every morning.      . isosorbide mononitrate (IMDUR) 30 MG 24 hr tablet Take 30 mg by mouth daily.      Marland Kitchen levothyroxine (SYNTHROID, LEVOTHROID) 100 MCG tablet Take 100 mcg by mouth daily before breakfast.      . lidocaine (LIDODERM) 5 % Place 1 patch onto the skin daily. Applies to right hip daily.  Remove & Discard patch within 12 hours or as directed by MD      . metoprolol tartrate (LOPRESSOR) 25 MG tablet Take 12.5 mg by mouth 2 (two) times daily.      Marland Kitchen nystatin (MYCOSTATIN) 100000 UNIT/ML suspension Take 500,000 Units by mouth 4 (four) times daily. Takes for 7 days.  First dose given 09/03/2012.      . senna-docusate (SENOKOT-S) 8.6-50 MG per tablet Take 1 tablet by mouth 2 (two) times daily.      . simvastatin (ZOCOR) 40 MG tablet Take 40 mg by mouth at bedtime.      . sulfamethoxazole-trimethoprim (BACTRIM DS) 800-160 MG per tablet Take 1 tablet by mouth every 12 (twelve) hours.      . tamsulosin (FLOMAX) 0.4 MG CAPS Take 0.4 mg by mouth daily.      Marland Kitchen TIMOLOL MALEATE OP Place 1 drop into the left eye at bedtime.      . nitroGLYCERIN (NITROSTAT) 0.4 MG SL tablet Place 0.4 mg under the tongue every 5 (five) minutes as needed for chest pain.        ROS:                                                                                                                                       History obtained from unobtainable from patient due to mental status and expressive aphasia  Neurologic Examination:                                                                                                      Blood pressure 134/73, pulse 108, SpO2 99.00%.  Mental Status: Alert, follows visual que and some verbal commands  such as squeezing hand, sticking tongue out.   Showing expressive aphasia.    Cranial Nerves: II: Discs flat bilaterally; Visual fields blinks to threat, pupils equal, round, reactive to light. III,IV, VI: ptosis not present, extra-ocular motions intact bilaterally V,VII: Face symmetric, facial sensation intact  bilaterally VIII: hearing normal bilaterally IX,X: gag reflex present XI: bilateral shoulder shrug XII: midline tongue extension Motor: Right : Upper extremity   4/5    Left:     Upper extremity   5/5  Lower extremity   5/5     Lower extremity   5/5 Tone and bulk:normal tone throughout; no atrophy noted Sensory: withdraws to pain bilateral arms and legs Deep Tendon Reflexes: 2+ and symmetric bilateral UE, Bilateral KJ shows 1+ and AJ absent Plantars: Right: downgoing   Left: downgoing Cerebellar: normal finger-to-nose bilaterally,  normal heel-to-shin test CV: pulses palpable throughout    Results for orders placed during the hospital encounter of 09/08/12 (from the past 48 hour(s))  CBC     Status: Abnormal   Collection Time    09/08/12  1:38 PM      Result Value Range   WBC 3.8 (*) 4.0 - 10.5 K/uL   RBC 3.50 (*) 4.22 - 5.81 MIL/uL   Hemoglobin 9.7 (*) 13.0 - 17.0 g/dL   HCT 16.1 (*) 09.6 - 04.5 %   MCV 81.7  78.0 - 100.0 fL   MCH 27.7  26.0 - 34.0 pg   MCHC 33.9  30.0 - 36.0 g/dL   RDW 40.9  81.1 - 91.4 %   Platelets 250  150 - 400 K/uL  DIFFERENTIAL     Status: None   Collection Time    09/08/12  1:38 PM      Result Value Range   Neutrophils Relative 63  43 - 77 %   Neutro Abs 2.4  1.7 - 7.7 K/uL   Lymphocytes Relative 23  12 - 46 %   Lymphs Abs 0.9  0.7 - 4.0 K/uL   Monocytes Relative 10  3 - 12 %   Monocytes Absolute 0.4  0.1 - 1.0 K/uL   Eosinophils Relative 3  0 - 5 %   Eosinophils Absolute 0.1  0.0 - 0.7 K/uL   Basophils Relative 1  0 - 1 %   Basophils Absolute 0.0  0.0 - 0.1 K/uL  POCT I-STAT, CHEM 8     Status: Abnormal   Collection Time     09/08/12  1:56 PM      Result Value Range   Sodium 130 (*) 135 - 145 mEq/L   Potassium 4.2  3.5 - 5.1 mEq/L   Chloride 93 (*) 96 - 112 mEq/L   BUN 25 (*) 6 - 23 mg/dL   Creatinine, Ser 7.82  0.50 - 1.35 mg/dL   Glucose, Bld 956 (*) 70 - 99 mg/dL   Calcium, Ion 2.13 (*) 1.13 - 1.30 mmol/L   TCO2 28  0 - 100 mmol/L   Hemoglobin 9.9 (*) 13.0 - 17.0 g/dL   HCT 08.6 (*) 57.8 - 46.9 %   Ct Head Wo Contrast  09/08/2012  *RADIOLOGY REPORT*  Clinical Data: Right-sided weakness.  CT HEAD WITHOUT CONTRAST  Technique:  Contiguous axial images were obtained from the base of the skull through the vertex without contrast.  Comparison: None.  Findings: Bone windows demonstrate clear paranasal sinuses and mastoid air cells.  Soft tissue windows demonstrate expected cerebral atrophy. Moderate low density in the periventricular white matter likely related to small vessel disease.Ventriculomegaly, felt to be secondary to cerebral atrophy.  This is mild. No  mass lesion, hemorrhage, hydrocephalus, acute infarct, intra-axial, or extra- axial fluid collection.  IMPRESSION: Cerebral atrophy and small vessel ischemic change, without acute intracranial abnormality. Report called to Dr. Thad Ranger  at 1:52 p.m.   Original Report Authenticated By: Jeronimo Greaves, M.D.     Assessment and plan discussed with with attending physician and they are in agreement.    Felicie Morn PA-C [facility] [phone]  09/08/2012, 2:19 PM   Patient seen and examined.  Clinical course and management discussed.  Necessary edits performed.  I agree with the above.  Assessment and plan of care developed and discussed below.    Assessment: 79 y.o. male presenting with difficulty with speech and right sided weakness felt to be TIA in the past but of prolonged duration today.  With prolonged symptoms may very well be an acute infarct.  Patient per son has recently been taken off anticoagulants.  Presents outside time window for tPA.  On  ASA as an outpatient.    Stroke Risk Factors - diabetes mellitus and CVA  Plan: 1. HgbA1c, fasting lipid panel 2. MRI, MRA  of the brain without contrast 3. PT consult, OT consult, Speech consult 4. Echocardiogram 5. Carotid dopplers 6. Prophylactic therapy-Antiplatelet med: Plavix - dose 75mg  daily 7. Risk factor modification 8. Telemetry monitoring 9. Frequent neuro checks  Thana Farr, MD [facility] [phone]  09/08/2012  6:11 PM

## 2012-09-08 NOTE — ED Provider Notes (Signed)
History     CSN: [id number]  Arrival date & time 09/08/12  1324   First MD Initiated Contact with Patient 09/08/12 1329      Chief Complaint  Patient presents with  . Code Stroke     HPI  The patient presents as a code stroke.  Per report the patient developed right-sided weakness, aphasia approximately 5 hours prior to ED arrival.  Since onset symptoms have been persistent according to her bystanders.  The patient is a nursing home resident.  He has multiple medical problems, including long-standing diabetes, multiple prior cardiac events, multiple prior strokes, and recurrent TIA, possibly up to 40. The patient is incapable of providing any aspect of the history of present illness, and this is per report, and per family members who arrive after the initial evaluation. They state that the patient was stopped on anticoagulants several weeks ago due to an increased fall risk.  He is currently taking aspirin daily. He denies pain initially.  He does have inability to speak clearly.  Past Medical History  Diagnosis Date  . Diabetes mellitus without complication   . Stroke     Past Surgical History  Procedure Laterality Date  . Cardiac surgery    . Leg surgery      No family history on file.  History  Substance Use Topics  . Smoking status: Never Smoker   . Smokeless tobacco: Not on file  . Alcohol Use: No      Review of Systems  Unable to perform ROS: Patient nonverbal    Allergies  Review of patient's allergies indicates no known allergies.  Home Medications  No current outpatient prescriptions on file.  BP 134/73  Pulse 108  SpO2 99%  Physical Exam  Nursing note and vitals reviewed. Constitutional:  Elderly male with cervical collar in place, spontaneous respiration  HENT:  Head: Normocephalic and atraumatic.  Eyes: Conjunctivae are normal. Right eye exhibits no discharge. Left eye exhibits no discharge.  Neck:  Soft cervical collar in place   Cardiovascular: Normal rate and regular rhythm.   Murmur heard. Pulmonary/Chest: Effort normal. No respiratory distress. He has no wheezes.  Abdominal: There is no tenderness.  Musculoskeletal:  There is diffuse atrophy.  The patient does move all to be spontaneously.  He seems hesitant to elevate the right shoulder secondary to shoulder pain, but can follow commands moderately with this extremity.   Neurological: He is alert.  Patient seems awake, alert, following commands, but is aphasic, with garbled speech.  He was ultimately spontaneously.  There is diffuse atrophy, but the patient can move spontaneously.  Psychiatric: He has a normal mood and affect.  Though the patient speaks minimally, he seems calm, offering responses as much as possible.    ED Course  Procedures (including critical care time)  Labs Reviewed  APTT - Abnormal; Notable for the following:    aPTT 39 (*)    All other components within normal limits  CBC - Abnormal; Notable for the following:    WBC 3.8 (*)    RBC 3.50 (*)    Hemoglobin 9.7 (*)    HCT 28.6 (*)    All other components within normal limits  COMPREHENSIVE METABOLIC PANEL - Abnormal; Notable for the following:    Sodium 127 (*)    Chloride 92 (*)    Glucose, Bld 304 (*)    BUN 24 (*)    Calcium 8.0 (*)    Total Protein 5.5 (*)  Albumin 2.1 (*)    Total Bilirubin 0.1 (*)    GFR calc non Af Amer 60 (*)    GFR calc Af Amer 69 (*)    All other components within normal limits  TROPONIN I - Abnormal; Notable for the following:    Troponin I 0.32 (*)    All other components within normal limits  GLUCOSE, CAPILLARY - Abnormal; Notable for the following:    Glucose-Capillary 272 (*)    All other components within normal limits  POCT I-STAT, CHEM 8 - Abnormal; Notable for the following:    Sodium 130 (*)    Chloride 93 (*)    BUN 25 (*)    Glucose, Bld 303 (*)    Calcium, Ion 1.09 (*)    Hemoglobin 9.9 (*)    HCT 29.0 (*)    All other  components within normal limits  ETHANOL  PROTIME-INR  DIFFERENTIAL  URINE RAPID DRUG SCREEN (HOSP PERFORMED)  URINALYSIS, ROUTINE W REFLEX MICROSCOPIC   Ct Head Wo Contrast  09/08/2012  *RADIOLOGY REPORT*  Clinical Data: Right-sided weakness.  CT HEAD WITHOUT CONTRAST  Technique:  Contiguous axial images were obtained from the base of the skull through the vertex without contrast.  Comparison: None.  Findings: Bone windows demonstrate clear paranasal sinuses and mastoid air cells.  Soft tissue windows demonstrate expected cerebral atrophy. Moderate low density in the periventricular white matter likely related to small vessel disease.Ventriculomegaly, felt to be secondary to cerebral atrophy.  This is mild. No  mass lesion, hemorrhage, hydrocephalus, acute infarct, intra-axial, or extra- axial fluid collection.  IMPRESSION: Cerebral atrophy and small vessel ischemic change, without acute intracranial abnormality. Report called to Dr. Thad Ranger at 1:52 p.m.   Original Report Authenticated By: Jeronimo Greaves, M.D.      No diagnosis found.  The initial evaluation was conducted with our neurology colleagues.  After the initial evaluation for stroke was canceled.  Head CT was unremarkable for bleeding. On subsequent examination the patient remained calm.  Pulse ox 99% on room air, this is normal. Cardiac monitor demonstrated sinus rhythm 95 this is normal.   Date: 09/08/2012  Rate: 107  Rhythm: normal sinus rhythm  QRS Axis: left  Intervals: QT prolonged  ST/T Wave abnormalities: nonspecific ST/T changes  Conduction Disutrbances:right bundle branch block and left anterior fascicular block  Narrative Interpretation:   Old EKG Reviewed: none available ABNORMAL  After the initial exam I confirmed the patient's history with the wife we also confirmed DO NOT RESUSCITATE status, specifically no intubation, no feeding tube, no CPR   MDM   This patient presents with ongoing neurologic changes  consistent with TIA, the longer lasting than his typical effects.  On exam he seems in no distress, and although there are some neurologic deficiencies, a series intact, and there is no emergent intervention indicated.  Given the ongoing deficiencies he was admitted for further evaluation and management.  Notably, the patient's nonischemic ECG, but there is an elevated troponin.  The patient's cardiac history for which he takes aspirin, and he and his wife have had multiple conversations with care providers regarding appropriate anticoagulant use given his fall risk profile.  Additional anticoagulant not provided in the emergency department secondary to this.  After confirmation patient's CODE STATUS desired, he was admitted for further evaluation and management.        Gerhard Munch, MD 09/08/12 [phone]

## 2012-09-08 NOTE — H&P (Signed)
PCP:   Bosie Clos, MD   Chief Complaint:  Aphasia.  HPI: This is an 79 year old male nursing facility resident, [address] [facility] in [place], where he is undergoing rehab, due to a recent hospitalization, with known history of DM, hypothyroidism, s/p multiple CVAs/TIAs, According to spouse, patient was on Elloquis, anticoagulant up to about a weak ago, but this was discontinued by his provider, due to recurrent falls, and he has been on ASA for approximately one week. 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 He has  bout a week ago developed a large "blood blister on the medial  Aspect, dorsum and volar aspect of left foot. Patient awakened at 0720 this AM and was at his baseline. At about 08:15 AM, spouse noticed that patient did not "seem right". He had slurred speech, although he appeared to understand commands. Blood glucose was checked, and was not low. He was observed till about 12:00 PM, then EMS was called, as symptoms failed to improve. In the ED, NIHSS scale was 9. Code stroke cancelled at 14:10 PM, as outside tPA time frame.    Allergies:   Allergies  Allergen Reactions  . Fentanyl   . Aggrenox (Aspirin-Dipyridamole Er) Other (See Comments)    REACTION:  unknown  . Prinivil (Lisinopril) Other (See Comments)    REACTION: unknown      Past Medical History  Diagnosis Date  . Diabetes mellitus without complication   . Stroke   . Thyroid disease   . 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 C3 cervical fracture 07/2011    unhealed d/t osteoporosis    Past Surgical History  Procedure Laterality Date  . Cardiac surgery    . Leg surgery Left     Prior to Admission medications   Medication Sig Start Date End Date Taking? Authorizing Provider  acetaminophen (TYLENOL) 650 MG CR tablet Take 1,300 mg by mouth every 8 (eight) hours  as needed for pain.   Yes Historical Provider, MD  aspirin 325 MG tablet Take 325 mg by mouth daily.   Yes Historical Provider, MD  citalopram (CELEXA) 10 MG tablet Take 10 mg by mouth daily.   Yes Historical Provider, MD  furosemide (LASIX) 40 MG tablet Take 40 mg by mouth 2 (two) times daily.   Yes Historical Provider, MD  Hypromellose (ISOPTO TEARS) 0.5 % SOLN Place 1 drop into both eyes daily.   Yes Historical Provider, MD  insulin NPH (HUMULIN N,NOVOLIN N) 100 UNIT/ML injection Inject 20 Units into the skin every morning.   Yes Historical Provider, MD  isosorbide mononitrate (IMDUR) 30 MG 24 hr tablet Take 30 mg by mouth daily.   Yes Historical Provider, MD  levothyroxine (SYNTHROID, LEVOTHROID) 100 MCG tablet Take 100 mcg by mouth daily before breakfast.   Yes Historical Provider, MD  lidocaine (LIDODERM) 5 % Place 1 patch onto the skin daily. Applies to right hip daily.  Remove & Discard patch within 12 hours or as directed by MD   Yes Historical Provider, MD  metoprolol tartrate (LOPRESSOR) 25 MG tablet Take 12.5 mg by mouth 2 (two) times daily.   Yes Historical Provider, MD  nystatin (MYCOSTATIN) 100000 UNIT/ML suspension Take 500,000 Units by mouth 4 (four) times daily. Takes for 7 days.  First dose given 09/03/2012.   Yes Historical Provider, MD  senna-docusate (SENOKOT-S) 8.6-50  MG per tablet Take 1 tablet by mouth 2 (two) times daily.   Yes Historical Provider, MD  simvastatin (ZOCOR) 40 MG tablet Take 40 mg by mouth at bedtime.   Yes Historical Provider, MD  sulfamethoxazole-trimethoprim (BACTRIM DS) 800-160 MG per tablet Take 1 tablet by mouth every 12 (twelve) hours.   Yes Historical Provider, MD  tamsulosin (FLOMAX) 0.4 MG CAPS Take 0.4 mg by mouth daily.   Yes Historical Provider, MD  TIMOLOL MALEATE OP Place 1 drop into the left eye at bedtime.   Yes Historical Provider, MD  nitroGLYCERIN (NITROSTAT) 0.4 MG SL tablet Place 0.4 mg under the tongue every 5 (five) minutes as needed for  chest pain.    Historical Provider, MD    Social History: Family reports that he has never smoked. He does not have any smokeless tobacco history on file. He reports that he does not drink alcohol or use illicit drugs.  No family history on file.  Review of Systems:  As per HPI and chief complaint. Per spouse, no fever, chills, chest pain, cough, shortness of breath, orthopnea, paroxysmal nocturnal dyspnea, nausea, diaphoresis, vomiting, diarrhea, belching, heartburn, hematemesis, melena, lower extremity swelling, pain, or redness. Patient had RUQ pain on 09/07/12, which responded to Lidoderm patch.Rest of systems review is unobtainable.   Physical Exam:  General:  Patient does not appear to be in obvious acute distress. Alert, not communicative, but follows simple commands accurately. Not short of breath at rest.  HEENT:  Mild clinical pallor, no jaundice, no conjunctival injection or discharge. NECK:  Encased in soft cervical collar.  CHEST:  Clinically clear to auscultation, no wheezes, no crackles. HEART:  Sounds 1 and 2 heard, normal, regular, no murmurs. ABDOMEN:  Full, soft, non-tender, no palpable organomegaly, no palpable masses, normal bowel sounds. GENITALIA:  Not examined. LOWER EXTREMITIES:  No pitting edema, lrft great toe is under dressings. Patient has adsorbent dressing applied to medial aspect and dorsum of left foot.  MUSCULOSKELETAL SYSTEM:  Generalized osteoarthritic changes. CENTRAL NERVOUS SYSTEM:  Has expressive aphasia, moving all limbs with some persuasion. Appears to have right arm/grip weakness.   Labs on Admission:  Results for orders placed during the hospital encounter of 09/08/12 (from the past 48 hour(s))  ETHANOL     Status: None   Collection Time    09/08/12  1:38 PM      Result Value Range   Alcohol, Ethyl (B) <11  0 - 11 mg/dL   Comment:            LOWEST DETECTABLE LIMIT FOR     SERUM ALCOHOL IS 11 mg/dL     FOR MEDICAL PURPOSES ONLY  PROTIME-INR      Status: None   Collection Time    09/08/12  1:38 PM      Result Value Range   Prothrombin Time 12.7  11.6 - 15.2 seconds   INR 0.96  0.00 - 1.49  APTT     Status: Abnormal   Collection Time    09/08/12  1:38 PM      Result Value Range   aPTT 39 (*) 24 - 37 seconds   Comment:            IF BASELINE aPTT IS ELEVATED,     SUGGEST PATIENT RISK ASSESSMENT     BE USED TO DETERMINE APPROPRIATE     ANTICOAGULANT THERAPY.  CBC     Status: Abnormal   Collection Time    09/08/12  1:38  PM      Result Value Range   WBC 3.8 (*) 4.0 - 10.5 K/uL   RBC 3.50 (*) 4.22 - 5.81 MIL/uL   Hemoglobin 9.7 (*) 13.0 - 17.0 g/dL   HCT 16.1 (*) 09.6 - 04.5 %   MCV 81.7  78.0 - 100.0 fL   MCH 27.7  26.0 - 34.0 pg   MCHC 33.9  30.0 - 36.0 g/dL   RDW 40.9  81.1 - 91.4 %   Platelets 250  150 - 400 K/uL  DIFFERENTIAL     Status: None   Collection Time    09/08/12  1:38 PM      Result Value Range   Neutrophils Relative 63  43 - 77 %   Neutro Abs 2.4  1.7 - 7.7 K/uL   Lymphocytes Relative 23  12 - 46 %   Lymphs Abs 0.9  0.7 - 4.0 K/uL   Monocytes Relative 10  3 - 12 %   Monocytes Absolute 0.4  0.1 - 1.0 K/uL   Eosinophils Relative 3  0 - 5 %   Eosinophils Absolute 0.1  0.0 - 0.7 K/uL   Basophils Relative 1  0 - 1 %   Basophils Absolute 0.0  0.0 - 0.1 K/uL  COMPREHENSIVE METABOLIC PANEL     Status: Abnormal   Collection Time    09/08/12  1:38 PM      Result Value Range   Sodium 127 (*) 135 - 145 mEq/L   Potassium 4.1  3.5 - 5.1 mEq/L   Chloride 92 (*) 96 - 112 mEq/L   CO2 30  19 - 32 mEq/L   Glucose, Bld 304 (*) 70 - 99 mg/dL   BUN 24 (*) 6 - 23 mg/dL   Creatinine, Ser 7.82  0.50 - 1.35 mg/dL   Calcium 8.0 (*) 8.4 - 10.5 mg/dL   Total Protein 5.5 (*) 6.0 - 8.3 g/dL   Albumin 2.1 (*) 3.5 - 5.2 g/dL   AST 27  0 - 37 U/L   ALT 18  0 - 53 U/L   Alkaline Phosphatase 77  39 - 117 U/L   Total Bilirubin 0.1 (*) 0.3 - 1.2 mg/dL   GFR calc non Af Amer 60 (*) >90 mL/min   GFR calc Af Amer 69 (*) >90  mL/min   Comment:            The eGFR has been calculated     using the CKD EPI equation.     This calculation has not been     validated in all clinical     situations.     eGFR's persistently     <90 mL/min signify     possible Chronic Kidney Disease.  TROPONIN I     Status: Abnormal   Collection Time    09/08/12  1:43 PM      Result Value Range   Troponin I 0.32 (*) <0.30 ng/mL   Comment:            Due to the release kinetics of cTnI,     a negative result within the first hours     of the onset of symptoms does not rule out     myocardial infarction with certainty.     If myocardial infarction is still suspected,     repeat the test at appropriate intervals.     CRITICAL RESULT CALLED TO, READ BACK BY AND VERIFIED WITH:     Michel Harrow RN @  [phone] GREEN R  POCT I-STAT, CHEM 8     Status: Abnormal   Collection Time    09/08/12  1:56 PM      Result Value Range   Sodium 130 (*) 135 - 145 mEq/L   Potassium 4.2  3.5 - 5.1 mEq/L   Chloride 93 (*) 96 - 112 mEq/L   BUN 25 (*) 6 - 23 mg/dL   Creatinine, Ser 8.11  0.50 - 1.35 mg/dL   Glucose, Bld 914 (*) 70 - 99 mg/dL   Calcium, Ion 7.82 (*) 1.13 - 1.30 mmol/L   TCO2 28  0 - 100 mmol/L   Hemoglobin 9.9 (*) 13.0 - 17.0 g/dL   HCT 95.6 (*) 21.3 - 08.6 %  GLUCOSE, CAPILLARY     Status: Abnormal   Collection Time    09/08/12  2:23 PM      Result Value Range   Glucose-Capillary 272 (*) 70 - 99 mg/dL  URINE RAPID DRUG SCREEN (HOSP PERFORMED)     Status: None   Collection Time    09/08/12  2:30 PM      Result Value Range   Opiates NONE DETECTED  NONE DETECTED   Cocaine NONE DETECTED  NONE DETECTED   Benzodiazepines NONE DETECTED  NONE DETECTED   Amphetamines NONE DETECTED  NONE DETECTED   Tetrahydrocannabinol NONE DETECTED  NONE DETECTED   Barbiturates NONE DETECTED  NONE DETECTED   Comment:            DRUG SCREEN FOR MEDICAL PURPOSES     ONLY.  IF CONFIRMATION IS NEEDED     FOR ANY PURPOSE, NOTIFY LAB     WITHIN 5  DAYS.                LOWEST DETECTABLE LIMITS     FOR URINE DRUG SCREEN     Drug Class       Cutoff (ng/mL)     Amphetamine      1000     Barbiturate      200     Benzodiazepine   200     Tricyclics       300     Opiates          300     Cocaine          300     THC              50  URINALYSIS, ROUTINE W REFLEX MICROSCOPIC     Status: Abnormal   Collection Time    09/08/12  2:30 PM      Result Value Range   Color, Urine YELLOW  YELLOW   APPearance CLEAR  CLEAR   Specific Gravity, Urine 1.017  1.005 - 1.030   pH 6.0  5.0 - 8.0   Glucose, UA >1000 (*) NEGATIVE mg/dL   Hgb urine dipstick LARGE (*) NEGATIVE   Bilirubin Urine NEGATIVE  NEGATIVE   Ketones, ur NEGATIVE  NEGATIVE mg/dL   Protein, ur NEGATIVE  NEGATIVE mg/dL   Urobilinogen, UA 0.2  0.0 - 1.0 mg/dL   Nitrite NEGATIVE  NEGATIVE   Leukocytes, UA NEGATIVE  NEGATIVE  URINE MICROSCOPIC-ADD ON     Status: None   Collection Time    09/08/12  2:30 PM      Result Value Range   Squamous Epithelial / LPF RARE  RARE   WBC, UA 0-2  <3 WBC/hpf   RBC / HPF 21-50  <3 RBC/hpf  Urine-Other LESS THAN 10 mL OF URINE SUBMITTED      Radiological Exams on Admission: Ct Head Wo Contrast  09/08/2012  *RADIOLOGY REPORT*  Clinical Data: Right-sided weakness.  CT HEAD WITHOUT CONTRAST  Technique:  Contiguous axial images were obtained from the base of the skull through the vertex without contrast.  Comparison: None.  Findings: Bone windows demonstrate clear paranasal sinuses and mastoid air cells.  Soft tissue windows demonstrate expected cerebral atrophy. Moderate low density in the periventricular white matter likely related to small vessel disease.Ventriculomegaly, felt to be secondary to cerebral atrophy.  This is mild. No  mass lesion, hemorrhage, hydrocephalus, acute infarct, intra-axial, or extra- axial fluid collection.  IMPRESSION: Cerebral atrophy and small vessel ischemic change, without acute intracranial abnormality. Report called to  Dr. Thad Ranger at 1:52 p.m.   Original Report Authenticated By: Jeronimo Greaves, M.D.     Assessment/Plan Active Problems:    1. Aphasia/Right sided weakness: Patient presented with sudden onset expressive aphasia, as well as right-sided weakness, with some improvement, in the following hours. Unfortunately, he presented outside the appropriate window for t-PA. He still has residual focal deficit at this time, although head Ct scan is negative for acute findings. Will admit to neuro-telemetry and carry out CVA/TIA work up, per protocol. Neuro-checks will be done. Patient was sitched from Elloquis to ASA about a week ago, due to high fall risk. He has already been seen by neurologist, Dr Thad Ranger, and we shall manage as recommended, depending on results of work up.  2. Hypothyroidism: Patient will be continue on thyroxine replacement therapy.  3. Diabetes mellitus: This is insulin-requiring , and is sub-optimally controlled, based on random blood glucose of 272. Will manage with SSI for now.  4. Cervical spine fracture: This is chronic, and patient is in cervical collar which will be continued.  5. Gangrene of foot: Patient long-standing dry gangrene of right big toe, and a more recent lesion of left foot. Will consult WOC.   Further management will depend on clinical course.   Comment: Family has confirmed that patient is DNR.    Time Spent on Admission: 1 hour.   Othello Dickenson,CHRISTOPHER 09/08/2012, 4:34 PM

## 2012-09-08 NOTE — ED Notes (Signed)
Pt was last seen normal at 0745, wife came to visit and noticed slurred speech at 0815. Pt has history of Multiple TIA.

## 2012-09-08 NOTE — Code Documentation (Signed)
79 year old patient presents to [facility] as Code Stroke from [facility] in [place].  Code stroke was called in field by EMS at 1314.  Patient arrived to ED at 1324.  Stroke team arrival at 1324.  To CT scan at 1326.  CBG was reported as 309 in the field.   ED RN Misty Stanley spoke with staff at SNF who states patient awakend at 0720 this AM and all was at his baseline.  At 0815 neuro changes were noted that presented as right side weakness and difficulty speaking.  Wife was present at noted that this was his usually presentation when he has TIA events and told the staff that he would get better with time.  NIHSS scale is 9 see doc flowsheet for details.  Dr. Thad Ranger present.  Code stroke cancelled at 1410 - outside tPA time frame.

## 2012-09-08 NOTE — Progress Notes (Signed)
      Dorna Bloom, Peng-Sian  Remember to initiate Hypoglycemia Order Set & complete CBG: 53  Treatment: D50 IV 25 mL  Symptoms: None  Follow-up CBG:  Time  2154   CBG Result:91  Possible Reasons for Event: Other: patient is NPO  Comments/MD notified: Hypoglycemic event notified Donnamarie Poag by text message   Becky Augusta  Remember to initiate Hypoglycemia Order Set & complete

## 2012-09-09 ENCOUNTER — Encounter (HOSPITAL_COMMUNITY): Payer: Self-pay | Admitting: Physician Assistant

## 2012-09-09 ENCOUNTER — Inpatient Hospital Stay (HOSPITAL_COMMUNITY): Payer: MEDICARE

## 2012-09-09 DIAGNOSIS — I4891 Unspecified atrial fibrillation: Secondary | ICD-10-CM

## 2012-09-09 DIAGNOSIS — I059 Rheumatic mitral valve disease, unspecified: Secondary | ICD-10-CM

## 2012-09-09 DIAGNOSIS — E15 Nondiabetic hypoglycemic coma: Secondary | ICD-10-CM | POA: Diagnosis present

## 2012-09-09 DIAGNOSIS — I25810 Atherosclerosis of coronary artery bypass graft(s) without angina pectoris: Secondary | ICD-10-CM

## 2012-09-09 LAB — LIPID PANEL
Cholesterol: 136 mg/dL (ref 0–200)
HDL: 49 mg/dL (ref >39)
LDL Cholesterol: 66 mg/dL (ref 0–99)
Total CHOL/HDL Ratio: 2.8 RATIO
Triglycerides: 104 mg/dL (ref <150)
VLDL: 21 mg/dL (ref 0–40)

## 2012-09-09 LAB — CBC
HCT: 28.3 % — ABNORMAL LOW (ref 39.0–52.0)
Hemoglobin: 9.6 g/dL — ABNORMAL LOW (ref 13.0–17.0)
MCH: 27.2 pg (ref 26.0–34.0)
MCHC: 33.9 g/dL (ref 30.0–36.0)
MCV: 80.2 fL (ref 78.0–100.0)
Platelets: 304 10*3/uL (ref 150–400)
RBC: 3.53 MIL/uL — ABNORMAL LOW (ref 4.22–5.81)
RDW: 13.8 % (ref 11.5–15.5)
WBC: 4.6 10*3/uL (ref 4.0–10.5)

## 2012-09-09 LAB — COMPREHENSIVE METABOLIC PANEL
Alkaline Phosphatase: 75 U/L (ref 39–117)
BUN: 18 mg/dL (ref 6–23)
Chloride: 98 mEq/L (ref 96–112)
Creatinine, Ser: 0.89 mg/dL (ref 0.50–1.35)
GFR calc Af Amer: 86 mL/min — ABNORMAL LOW (ref >90)
GFR calc non Af Amer: 74 mL/min — ABNORMAL LOW (ref >90)
Glucose, Bld: 91 mg/dL (ref 70–99)
Potassium: 3.7 mEq/L (ref 3.5–5.1)
Total Bilirubin: 0.2 mg/dL — ABNORMAL LOW (ref 0.3–1.2)

## 2012-09-09 LAB — TROPONIN I: Troponin I: <0.3 ng/mL (ref <0.30)

## 2012-09-09 LAB — GLUCOSE, CAPILLARY
Glucose-Capillary: 137 mg/dL — ABNORMAL HIGH (ref 70–99)
Glucose-Capillary: 39 mg/dL — CL (ref 70–99)

## 2012-09-09 LAB — HEMOGLOBIN A1C
Hgb A1c MFr Bld: 8.2 % — ABNORMAL HIGH (ref <5.7)
Mean Plasma Glucose: 189 mg/dL — ABNORMAL HIGH (ref <117)

## 2012-09-09 MED ORDER — DEXTROSE-NACL 5-0.9 % IV SOLN
INTRAVENOUS | Status: DC
  Administered 2012-09-09 (×2): via INTRAVENOUS

## 2012-09-09 MED ORDER — METOPROLOL TARTRATE 1 MG/ML IV SOLN
5.0000 mg | Freq: Four times a day (QID) | INTRAVENOUS | Status: DC
  Administered 2012-09-09 – 2012-09-10 (×3): 5 mg via INTRAVENOUS
  Filled 2012-09-09 (×10): qty 5

## 2012-09-09 MED ORDER — DILTIAZEM HCL 25 MG/5ML IV SOLN
10.0000 mg | Freq: Once | INTRAVENOUS | Status: AC
  Administered 2012-09-09: 10 mg via INTRAVENOUS
  Filled 2012-09-09: qty 5

## 2012-09-09 MED ORDER — DEXTROSE 50 % IV SOLN
INTRAVENOUS | Status: AC
  Filled 2012-09-09: qty 50

## 2012-09-09 MED ORDER — BIOTENE DRY MOUTH MT LIQD
15.0000 mL | Freq: Two times a day (BID) | OROMUCOSAL | Status: DC
  Administered 2012-09-09 – 2012-09-12 (×7): 15 mL via OROMUCOSAL

## 2012-09-09 MED ORDER — RESOURCE THICKENUP CLEAR PO POWD
ORAL | Status: DC | PRN
  Filled 2012-09-09 (×2): qty 125

## 2012-09-09 MED ORDER — DEXTROSE 50 % IV SOLN
50.0000 mL | Freq: Once | INTRAVENOUS | Status: AC | PRN
  Administered 2012-09-09: 50 mL via INTRAVENOUS

## 2012-09-09 MED ORDER — BIOTENE DRY MOUTH MT LIQD: 15.0000 mL | Freq: Two times a day (BID) | OROMUCOSAL | Status: DC | PRN

## 2012-09-09 NOTE — Progress Notes (Signed)
  Echocardiogram 2D Echocardiogram has been performed.  Margreta Journey 09/09/2012, 9:41 AM

## 2012-09-09 NOTE — Evaluation (Signed)
Physical Therapy Evaluation Patient Details Name: Benjamin Dominguez MRN: [id number] DOB: 1923-03-08 Today's Date: 09/09/2012 Time: 9562-1308 PT Time Calculation (min): 43 min  PT Assessment / Plan / Recommendation Clinical Impression  79 y/o admitted for aphasia, presented with R sided weakness and communication difficulties. Stroke workup is negative. Presents with afib. Pt. tolerated treatment well, willing to work with [place].  PT recommends SNF upon DC.     PT Assessment  Patient needs continued PT services    Follow Up Recommendations  SNF (Pt. preference is [facility] in [place])    Does the patient have the potential to tolerate intense rehabilitation     NA  Barriers to Discharge None      Equipment Recommendations  None recommended by PT    Recommendations for Other Services   NA  Frequency Min 2X/week    Precautions / Restrictions Precautions Precautions: Cervical;Fall Required Braces or Orthoses: Cervical Brace Cervical Brace: Soft collar Restrictions Weight Bearing Restrictions: No   Pertinent Vitals/Pain See vitals flowsheet      Mobility  Bed Mobility Bed Mobility: Rolling Left;Supine to Sit;Sitting - Scoot to Edge of Bed Rolling Left: 6: Modified independent (Device/Increase time);With rail Supine to Sit: 4: Min assist;Other (comment) (Needed UE assist with L arm.) Sitting - Scoot to Edge of Bed: 4: Min assist;Other (comment) (Used bed pad to get pt. to edge of bed) Transfers Transfers: Sit to Stand;Stand to Sit Sit to Stand: From bed;From toilet;4: Min guard;With upper extremity assist;With armrests Stand to Sit: 4: Min guard;With upper extremity assist;With armrests;To bed;To toilet Details for Transfer Assistance: Needed min guard for safety Ambulation/Gait Ambulation/Gait Assistance: 4: Min guard Ambulation Distance (Feet): 50 Feet Assistive device: Rolling walker Ambulation/Gait Assistance Details: Min guard for safety Gait Pattern: Step-to  pattern;Shuffle;Trunk flexed (L foot slap) Wheelchair Mobility Wheelchair Mobility: No        PT Diagnosis: Difficulty walking;Abnormality of gait;Generalized weakness  PT Problem List: Decreased strength;Decreased balance;Decreased mobility;Decreased range of motion;Cardiopulmonary status limiting activity;Impaired sensation;Decreased activity tolerance PT Treatment Interventions: Gait training;Therapeutic activities;Therapeutic exercise;Functional mobility training;Balance training   PT Goals Acute Rehab PT Goals PT Goal Formulation: With family Time For Goal Achievement: 09/23/12 Potential to Achieve Goals: Good Pt will Roll Supine to Right Side: with supervision PT Goal: Rolling Supine to Right Side - Progress: Goal set today Pt will Roll Supine to Left Side: with supervision PT Goal: Rolling Supine to Left Side - Progress: Goal set today Pt will go Supine/Side to Sit: with supervision;with rail PT Goal: Supine/Side to Sit - Progress: Goal set today Pt will go Sit to Supine/Side: with supervision;with rail PT Goal: Sit to Supine/Side - Progress: Goal set today Pt will go Sit to Stand: with supervision;with upper extremity assist PT Goal: Sit to Stand - Progress: Goal set today Pt will go Stand to Sit: with supervision;with upper extremity assist PT Goal: Stand to Sit - Progress: Goal set today Pt will Ambulate: 51 - 150 feet;with supervision;with rolling walker PT Goal: Ambulate - Progress: Goal set today  Visit Information  Last PT Received On: 09/09/12 Assistance Needed: +1    Subjective Data  Subjective: Pt. reported that he was not in any pain. Wife reported that communication had improved since yesterday.   Prior Functioning  Home Living Lives With: Family Available Help at Discharge: Family;Available 24 hours/day Type of Home: Skilled Nursing Facility Home Access: Stairs to enter [employer] of Steps: 5 Home Layout: One level Bathroom Shower/Tub:  Health visitor: Handicapped height Bathroom  Accessibility: Yes How Accessible: Accessible via walker Home Adaptive Equipment: Walker - rolling;Walker - four wheeled Additional Comments: Pt. chair lift at home for 5 entry steps. Prior Function Level of Independence: Needs assistance Needs Assistance: Bathing;Dressing;Feeding;Grooming;Toileting;Meal Prep;Light Housekeeping;Gait;Transfers Bath: Supervision/set-up Gait Assistance: Pt. was walking with rolling walker and PT. Able to Take Stairs?: No Driving: No Comments: Wife reports sponge bath that Pt. does himself. Communication Communication: Expressive difficulties;HOH Dominant Hand: Right    Cognition  Cognition Overall Cognitive Status: History of cognitive impairments - at baseline Arousal/Alertness: Awake/alert Orientation Level: Disoriented X4 Behavior During Session: WFL for tasks performed Cognition - Other Comments: Wife reported that pt. needed her assistance for memory.    Extremity/Trunk Assessment Right Upper Extremity Assessment RUE ROM/Strength/Tone: Deficits;Due to pain RUE ROM/Strength/Tone Deficits: Pt. 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 Right Lower Extremity Assessment RLE ROM/Strength/Tone: Deficits RLE ROM/Strength/Tone Deficits: Pt. was able to lift leg against gravity, could bend knee while supine, able to Df and PF in supine. RLE Sensation: Deficits RLE Sensation Deficits: Pt. reported that he could not feel light touch below knee level. [place] on R foot. Left Lower Extremity Assessment LLE ROM/Strength/Tone: Deficits LLE ROM/Strength/Tone Deficits: Pt. was able to lift leg against gravity, could bend knee while supine, DF and PF limited by previous reported ankle fracture. LLE Sensation: Deficits LLE Sensation Deficits: Pt. reported that he could not feel light touch below knee level. L foot had hematoma. Trunk Assessment Trunk Assessment: Kyphotic   Balance  Balance Balance Assessed: No  End of Session PT - End of Session Equipment Utilized During Treatment: Gait belt;Cervical collar Activity Tolerance: Patient tolerated treatment well Patient left: in bed;with call bell/phone within reach;with family/visitor present;with bed alarm set    Nehemiah Montee B. Shaheed Schmuck, PT, DPT [phone]   09/09/2012, 3:18 PM

## 2012-09-09 NOTE — Progress Notes (Signed)
Hypoglycemic Event  CBG: 39  Treatment: D50 IV 50 mL  Symptoms: None  Follow-up CBG: Time:12:41 am CBG Result:140  Possible Reasons for Event: Other: patient is NPO  Comments/MD notified: K KIrby notified    Farha Dano, Peng-Sian  Remember to initiate Hypoglycemia Order Set & complete

## 2012-09-09 NOTE — Progress Notes (Signed)
Patient ID: Benjamin Dominguez  male  ZOX:[id number]    DOB: 1922-06-08    DOA: 09/08/2012  PCP: Bosie Clos, MD  Assessment/Plan: Principal Problem:   Aphasia/ right-sided weakness : ? TIA versus currently to hypoglycemia episode - MRI of the brain showed no acute infarct, abnormal appearance C2 vertebrae (the patient has a reported history of chronic C2 fracture, currently in C-collar) - Continue stroke workup, 2-D echo showed an EF of 35%, grade 2 diastolic dysfunction hypokinesis of the basal to mid ventricle segments, cannot rule out prior mitral valve repair moderate, cannot rule out severe MR, no prior echo or stress test available - Currently n.p.o., awaiting swallow evaluation  Active Problems:  Afib with RVR with Cardiomyopathy, MI 2 weeks ago: EF 35%, follows Dr Waldron Labs, was on eliquis which was dc'ed a week ago by Dr Waldron Labs because of falls (falls every 3 weeks per wife). He had an MI 2 weeks and was admitted at [facility]. He was then dc'ed to rehab at [facility] [place]. Patient was sent to [facility] for "stroke like symptoms" and had multiple TIA's in the past.      - Continue aspirin, given Cardizem 10 mg IV x1,  - Obtain EKG, serial cardiac enzymes, called cardiology consultation - NPO, failed swallow eval, formal swallow evaluation pending    Hypothyroidism - Continue IV Synthroid     Diabetes mellitus - Continue D5 drip  Chronic C2 Cervical spine fracture - Continue c-collar    Gangrene of foot- chronic  - obtain foot x-rays to rule out osteomyelitis    Hypoglycemia - Cont D5 drip, until tolerating PO  DVT Prophylaxis:  Code Status:  Disposition: Was in the rehabilitation prior to this admission    Subjective: Patient denies any specific complaints at this time at the time of my examination however then had A. fib with RVR with heart rate in 130s to 140s, given Cardizem IV x1  Objective: Weight change:   Intake/Output Summary (Last 24 hours)  at 09/09/12 1428 Last data filed at 09/09/12 0600  Gross per 24 hour  Intake      0 ml  Output    650 ml  Net   -650 ml   Blood pressure 142/68, pulse 110, temperature 98.2 F (36.8 C), temperature source Oral, resp. rate 20, height 5\' 8"  (1.727 m), weight 61.236 kg (135 lb), SpO2 97.00%.  Physical Exam: General: Alert and awake, oriented x3, not in any acute distress., C-collar, some expressive aphasia CVS: S1-S2 clear, no murmur rubs or gallops Chest: clear to auscultation bilaterally, no wheezing, rales or rhonchi Abdomen: soft nontender, nondistended, normal bowel sounds, no organomegaly Extremities: no cyanosis, clubbing or edema noted bilaterally, dressing intact on the right toe and left foot   Lab Results: Basic Metabolic Panel:  Recent Labs Lab 09/08/12 1338 09/08/12 1356 09/08/12 1722 09/09/12 0530  NA 127* 130*  --  132*  K 4.1 4.2  --  3.7  CL 92* 93*  --  98  CO2 30  --   --  28  GLUCOSE 304* 303*  --  91  BUN 24* 25*  --  18  CREATININE 1.07 1.20 1.01 0.89  CALCIUM 8.0*  --   --  8.1*   Liver Function Tests:  Recent Labs Lab 09/08/12 1338 09/09/12 0530  AST 27 23  ALT 18 16  ALKPHOS 77 75  BILITOT 0.1* 0.2*  PROT 5.5* 5.5*  ALBUMIN 2.1* 2.1*   No results found for  this basename: LIPASE, AMYLASE,  in the last 168 hours No results found for this basename: AMMONIA,  in the last 168 hours CBC:  Recent Labs Lab 09/08/12 1338  09/08/12 1722 09/09/12 0530  WBC 3.8*  --  3.6* 4.6  NEUTROABS 2.4  --   --   --   HGB 9.7*  < > 9.8* 9.6*  HCT 28.6*  < > 28.2* 28.3*  MCV 81.7  --  80.3 80.2  PLT 250  --  282 304  < > = values in this interval not displayed. Cardiac Enzymes:  Recent Labs Lab 09/08/12 1722 09/08/12 2229 09/09/12 0530  TROPONINI <0.30 <0.30 <0.30   BNP: No components found with this basename: POCBNP,  CBG:  Recent Labs Lab 09/09/12 0020 09/09/12 0042 09/09/12 0359 09/09/12 1034 09/09/12 1129  GLUCAP 39* 140* 89 112*  137*     Micro Results: No results found for this or any previous visit (from the past 240 hour(s)).  Studies/Results: Dg Chest 2 View  09/08/2012  *RADIOLOGY REPORT*  Clinical Data: Stroke, diabetic  CHEST - 2 VIEW  Comparison: None.  Findings: There are multilobar patchy airspace opacities, most prominent at the lung bases, with small pleural effusions.  Kerley B lines are present.  Mild cardiomegaly noted.  Central vascular congestion is present.  Evidence of prior CABG.  Bilateral, right greater than left, glenohumeral degenerative change.  IMPRESSION: Constellation of findings suggests pulmonary edema.  Follow-up after diuresis may be helpful to exclude underlying pulmonary opacity.   Original Report Authenticated By: Christiana Pellant, M.D.    Ct Head Wo Contrast  09/08/2012  *RADIOLOGY REPORT*  Clinical Data: Right-sided weakness.  CT HEAD WITHOUT CONTRAST  Technique:  Contiguous axial images were obtained from the base of the skull through the vertex without contrast.  Comparison: None.  Findings: Bone windows demonstrate clear paranasal sinuses and mastoid air cells.  Soft tissue windows demonstrate expected cerebral atrophy. Moderate low density in the periventricular white matter likely related to small vessel disease.Ventriculomegaly, felt to be secondary to cerebral atrophy.  This is mild. No  mass lesion, hemorrhage, hydrocephalus, acute infarct, intra-axial, or extra- axial fluid collection.  IMPRESSION: Cerebral atrophy and small vessel ischemic change, without acute intracranial abnormality. Report called to Dr. Thad Ranger at 1:52 p.m.   Original Report Authenticated By: Jeronimo Greaves, M.D.    Mr Brain Wo Contrast  09/09/2012  *RADIOLOGY REPORT*  Clinical Data:  79 year old diabetic patient presenting with episode of slurred speech.  History of cervical fracture post fall.  MRI BRAIN WITHOUT CONTRAST MRA HEAD WITHOUT CONTRAST  Technique: Multiplanar, multiecho pulse sequences of the brain  and surrounding structures were obtained according to standard protocol without intravenous contrast.  Angiographic images of the head were obtained using MRA technique without contrast.  Comparison: 09/08/2012 head CT.  No other exams available for review.  MRI HEAD  Findings:  Mild motion degradation.  No acute infarct.  No intracranial hemorrhage.  Moderate small vessel disease type changes.  Global atrophy without hydrocephalus.  Major intracranial vascular structures are patent.  Bilateral mastoid air cells/middle ear opacification.  Minimal paranasal sinus mucosal thickening.  Abnormal appearance of the C2 vertebra.  This may be related to patient's report history of fracture with the surrounding soft tissue prominence representing reaction to the fracture and transverse ligament hypertrophy.  As the patient is diabetic and has gangrene of the toe, infection not excluded in the proper clinical setting.  This is incompletely assessed on present exam  and is causing spinal stenosis and cord flattening.  IMPRESSION: No acute infarct.  Moderate small vessel disease type changes.  Global atrophy without hydrocephalus.  Bilateral mastoid air cells/middle ear opacification.  Minimal paranasal sinus mucosal thickening.  Abnormal appearance of the C2 vertebra.  This may be related to patient's report history of fracture with the surrounding soft tissue prominence representing reaction to the fracture and transverse ligament hypertrophy.  As the patient is diabetic and has gangrene of the toe, infection not excluded in the proper clinical setting.  This is incompletely assessed on present exam and is causing spinal stenosis and cord flattening.  MRA HEAD  Findings: Motion degraded exam.  This limits grading stenosis accurately and for evaluating for possibility of detecting small aneurysm.  Ectatic distal vertical cervical segment of the internal carotid arteries greater on the right.  Mild narrowing and irregularity  cavernous segment of the internal carotid artery bilaterally with small bulges on the left probably related to atherosclerotic type changes rather than discrete aneurysm.  Moderate narrowing left-sided and mild narrowing right sided supraclinoid aspect of the internal carotid artery.  Moderate narrowing left-sided and mild irregularity of right-sided A1 segment of the anterior cerebral artery.  Mild narrowing left M1 segment left middle cerebral artery.  Moderate narrowing middle cerebral artery branch vessels bilaterally with decreased number visualized right middle cerebral artery branches.  Left vertebral artery is dominant.  Mild narrowing distal right vertebral artery.  Mild to moderate narrowing proximal basilar artery.  Nonvisualization AICAs.  Moderate tandem stenoses superior cerebellar artery bilaterally and the proximal aspect posterior cerebral artery bilaterally with distal posterior cerebral artery moderate branch vessel irregularity bilaterally.  IMPRESSION: Intracranial atherosclerotic type changes as detailed above.  Exam is motion degraded.   Original Report Authenticated By: Lacy Duverney, M.D.    Mr Mra Head/brain Wo Cm  09/09/2012  *RADIOLOGY REPORT*  Clinical Data:  78 year old diabetic patient presenting with episode of slurred speech.  History of cervical fracture post fall.  MRI BRAIN WITHOUT CONTRAST MRA HEAD WITHOUT CONTRAST  Technique: Multiplanar, multiecho pulse sequences of the brain and surrounding structures were obtained according to standard protocol without intravenous contrast.  Angiographic images of the head were obtained using MRA technique without contrast.  Comparison: 09/08/2012 head CT.  No other exams available for review.  MRI HEAD  Findings:  Mild motion degradation.  No acute infarct.  No intracranial hemorrhage.  Moderate small vessel disease type changes.  Global atrophy without hydrocephalus.  Major intracranial vascular structures are patent.  Bilateral mastoid  air cells/middle ear opacification.  Minimal paranasal sinus mucosal thickening.  Abnormal appearance of the C2 vertebra.  This may be related to patient's report history of fracture with the surrounding soft tissue prominence representing reaction to the fracture and transverse ligament hypertrophy.  As the patient is diabetic and has gangrene of the toe, infection not excluded in the proper clinical setting.  This is incompletely assessed on present exam and is causing spinal stenosis and cord flattening.  IMPRESSION: No acute infarct.  Moderate small vessel disease type changes.  Global atrophy without hydrocephalus.  Bilateral mastoid air cells/middle ear opacification.  Minimal paranasal sinus mucosal thickening.  Abnormal appearance of the C2 vertebra.  This may be related to patient's report history of fracture with the surrounding soft tissue prominence representing reaction to the fracture and transverse ligament hypertrophy.  As the patient is diabetic and has gangrene of the toe, infection not excluded in the proper clinical setting.  This  is incompletely assessed on present exam and is causing spinal stenosis and cord flattening.  MRA HEAD  Findings: Motion degraded exam.  This limits grading stenosis accurately and for evaluating for possibility of detecting small aneurysm.  Ectatic distal vertical cervical segment of the internal carotid arteries greater on the right.  Mild narrowing and irregularity cavernous segment of the internal carotid artery bilaterally with small bulges on the left probably related to atherosclerotic type changes rather than discrete aneurysm.  Moderate narrowing left-sided and mild narrowing right sided supraclinoid aspect of the internal carotid artery.  Moderate narrowing left-sided and mild irregularity of right-sided A1 segment of the anterior cerebral artery.  Mild narrowing left M1 segment left middle cerebral artery.  Moderate narrowing middle cerebral artery branch  vessels bilaterally with decreased number visualized right middle cerebral artery branches.  Left vertebral artery is dominant.  Mild narrowing distal right vertebral artery.  Mild to moderate narrowing proximal basilar artery.  Nonvisualization AICAs.  Moderate tandem stenoses superior cerebellar artery bilaterally and the proximal aspect posterior cerebral artery bilaterally with distal posterior cerebral artery moderate branch vessel irregularity bilaterally.  IMPRESSION: Intracranial atherosclerotic type changes as detailed above.  Exam is motion degraded.   Original Report Authenticated By: Lacy Duverney, M.D.     Medications: Scheduled Meds: . antiseptic oral rinse  15 mL Mouth Rinse BID  . aspirin  300 mg Rectal Daily   Or  . aspirin  325 mg Oral Daily  . heparin  5,000 Units Subcutaneous Q8H  . insulin aspart  0-9 Units Subcutaneous Q4H  . levothyroxine  50 mcg Intravenous QAC breakfast  . lidocaine  1 patch Transdermal Q24H  . polyvinyl alcohol  1 drop Both Eyes Daily  . timolol  1 drop Left Eye Daily      LOS: 1 day   Estrellita Lasky M.D. [facility] 09/09/2012, 2:28 PM Pager: [phone]  If 7PM-7AM, please contact night-coverage [email or web address] Password [email or web address]

## 2012-09-09 NOTE — Progress Notes (Signed)
Called by CCMD for pt. Conversion to a-fib/high heart rate. Per CCMD heart rate 130s-170s sustained. Pt. Assessed, denies SOB and CP. Dr. Isidoro Donning notified; new orders received. Will monitor.

## 2012-09-09 NOTE — Progress Notes (Signed)
SLP Cancellation Note  Patient Details Name: Joneric Streight MRN: [id number] DOB: Nov 20, 1922   Cancelled treatment:       Reason Eval/Treat Not Completed: Patient at procedure or test/unavailable.  Will f/u next date.   Blenda Mounts Laurice 09/09/2012, 12:49 PM

## 2012-09-09 NOTE — Evaluation (Signed)
Clinical/Bedside Swallow Evaluation Patient Details  Name: Benjamin Dominguez MRN: [id number] Date of Birth: 1922-09-13  Today's Date: 09/09/2012 Time: 4098-1191 SLP Time Calculation (min): 34 min  Past Medical History:  Past Medical History  Diagnosis Date  . Diabetes mellitus without complication   . Stroke     Multiple TIAs/CVAs  . Hypothyroidism   . 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 C3 cervical fracture 07/2011    unhealed d/t osteoporosis  . CAD (coronary artery disease)     CABG 1988, last cath 2003 ~ distal disease per wife. Does not want any more caths, MI 07/2012 managed medically at [facility] per wife.  Clayborne Dana    Past Surgical History:  Past Surgical History  Procedure Laterality Date  . Cardiac surgery    . Leg surgery Left    HPI:  Benjamin Dominguez is an 79 y.o. male who resides at Nursing home [address] [facility] [place] in [place]. Patient was last seen normal at 0720 by staff on 09/08/2012. At 0815 it w0as noted that patient had difficulty speaking and what they thought was right sided weakness. Of note: patient has some right arm weakness from previous C3 fracture which he currently is wearing C-collar. Wife was present at nursing home, and stated he usually has expressive difficulties and right sided weakness with previous TIA's but usually clears quickly. His symptoms today did not resolve as quickly as usual. Currently patient is still showing expressive aphasia, and right arm weakness. Initial CT head showed atrophy without acute infarct. Patient was not a TPA candidate secondary to delay in arrival. He was admitted for further evaluation and treatment.   Assessment / Plan / Recommendation Clinical Impression  Patient's wife reports that the patient gets choked frequently and easily on water (especially when taking pills).  She states this is not a new problem, but has been going on for "some time."  Pt. does exhibit s/s of aspiration with thin  liquids, including immediate audible throat clearing after swallows, intermittent cough, and wet vocal quality.  There were no overt s/s with nectar thick liquids, purees, or soft solids.    Aspiration Risk  Mild    Diet Recommendation Dysphagia 3 (Mechanical Soft);Nectar-thick liquid (Chopped meats)   Liquid Administration via: Cup;No straw Medication Administration: Whole meds with puree (or half pills if large) Supervision: Full supervision/cueing for compensatory strategies;Staff feed patient Compensations: Slow rate;Small sips/bites;Check for pocketing Postural Changes and/or Swallow Maneuvers: Seated upright 90 degrees    Other  Recommendations Oral Care Recommendations: Oral care QID Other Recommendations: Order thickener from pharmacy;Prohibited food (jello, ice cream, thin soups);Remove water pitcher;Clarify dietary restrictions   Follow Up Recommendations  Skilled Nursing facility    Frequency and Duration min 1 x/week  1 week   Pertinent Vitals/Pain n/a    SLP Swallow Goals Patient will consume recommended diet without observed clinical signs of aspiration with: Minimal assistance   Swallow Study Prior Functional Status  Type of Home: Skilled Nursing Facility Lives With: Family Available Help at Discharge: Family;Available 24 hours/day    General Date of Onset:  ("A long time.") HPI: Benjamin Dominguez is an 79 y.o. male who resides at Nursing home [address] [facility] [place] in [place]. Patient was last seen normal at 0720 by staff on 09/08/2012. At 0815 it w0as noted that patient had difficulty speaking and what they thought was right sided weakness. Of note: patient has some right arm weakness from previous C3 fracture which he  currently is wearing C-collar. Wife was present at nursing home, and stated he usually has expressive difficulties and right sided weakness with previous TIA's but usually clears quickly. His symptoms today did not resolve as quickly as usual.  Currently patient is still showing expressive aphasia, and right arm weakness. Initial CT head showed atrophy without acute infarct. Patient was not a TPA candidate secondary to delay in arrival. He was admitted for further evaluation and treatment. Type of Study: Bedside swallow evaluation Previous Swallow Assessment: None found.  Wife denies pt. having had an evaluation before Diet Prior to this Study: NPO Temperature Spikes Noted: No Respiratory Status: Room air History of Recent Intubation: No Behavior/Cognition: Alert;Cooperative;Pleasant mood;Hard of hearing Oral Cavity - Dentition: Adequate natural dentition (Partials) Self-Feeding Abilities: Needs assist Patient Positioning: Upright in bed Baseline Vocal Quality: Clear Volitional Cough: Cognitively unable to elicit Volitional Swallow: Unable to elicit    Oral/Motor/Sensory Function Overall Oral Motor/Sensory Function: Impaired Lingual ROM:  (unable to elevate tongue tip) Lingual Symmetry: Within Functional Limits   Ice Chips Ice chips: Not tested   Thin Liquid Thin Liquid: Impaired Presentation: Spoon;Cup Pharyngeal  Phase Impairments: Suspected delayed Swallow;Wet Vocal Quality;Throat Clearing - Immediate (wife reports frequent choking on water, esp. with pills)    Nectar Thick Nectar Thick Liquid: Within functional limits Presentation: Spoon;Cup   Honey Thick Honey Thick Liquid: Not tested   Puree Puree: Within functional limits   Solid   GO    Solid: Impaired Oral Phase Impairments: Reduced lingual movement/coordination;Impaired anterior to posterior transit       Maryjo Rochester T 09/09/2012,5:38 PM

## 2012-09-09 NOTE — Progress Notes (Signed)
Utilization Review Completed.Benjamin Dominguez T4/04/2013  

## 2012-09-09 NOTE — Consult Note (Signed)
WOC consult Note Reason for Consult: Consult requested for bilat feet.  Wound type: Left plantar foot with blood filled blister which has ruptured and evolved into a partial thickness wound 6X7X.1cm, dark purple wound bed, small red drainage. Right great toe with 2 necrotic areas; 4X3cm 80% eschar, 20% red, small tan drainage, some odor. Right inner foot with dry callous area 100% eschar, 1.5X1.5cm Dressing procedure/placement/frequency: Foam dressing to protect left foot and absorb drainage.  Xerofrom gauze to protect eschar on right foot. Pt has a history of gangrene to right foot according to progress notes.  Topical care will not be effective for this diagnosis.  If aggressive plan of care is desired, please consult VVS service. Please re-consult if further assistance is needed.  Thank-you,  Cammie Mcgee MSN, RN, CWOCN, [place], CNS [phone]

## 2012-09-09 NOTE — Consult Note (Signed)
CARDIOLOGY CONSULT NOTE  Patient ID: Benjamin Dominguez, MRN: [id number], DOB/AGE: November 20, 1922 79 y.o. Admit date: 09/08/2012   Date of Consult: 09/09/2012 Primary Physician: Bosie Clos, MD Primary Cardiologist: Waldron Labs  Chief Complaint: slurred speech Reason for Consult: afib  HPI: This is an 79 year old male NHP where he is undergoing rehab for recent hospitalization for MI managed medically and recent cervical fracture from falls. He has history of CAD s/p CABG 26 yrs ago with last cath 2009 with distal disease per wife. She says they have both agreed he would never want another catheterization. He also has longstanding history of A-fib, multiple TIA/CVAs, DM with peripheral neurology leading to very frequent falls. Due to his falls (at least every 3 weeks, and generally unsteady on his feet), his primary cardiologist recently discontinued his Eliquis. He presented to [facility] yesterday afternoon after having been noted to have slurred speech and didn't "seem right." Glucose was checked and reportedly WNL. EMS was called at 12pm after symptoms failed to improve. Neurology has seen the patient and his symptoms are felt questionably due to transient hypoglycemia (he has had several episodes here in the hospital) versus TIA. MRA of the head did not show acute infarct. We are asked to see regarding afib with RVR and cardiomyopathy. Initial troponin 0.32, subsequent sets negative. He had multiple lab abnormalities on admission including anemia, leukopenia 3.8, Na 127, BUN 24, albumin 2.1. 2D echo showing EF 35% with unusual wall motion pattern cannot r/o reverse Takotsubo pattern, grade 2 diastolic dysfunction, moderate-severe MR, moderate pulm HTN. The patient is quite limited in providing any history but currently denies any CP, SOB, palpitations, orthopnea, LEE. His wife denies any known episodes of syncope. He went into a rapid afib this afternoon followed by atrial tach vs flutter and was  given 10mg  cardizem bolus with eventual conversion to NSR. He was unaware of this rhythm. He is not currently taking PO's due to awaiting swallow screen.  Past Medical History  Diagnosis Date  . Diabetes mellitus without complication   . Stroke     Multiple TIAs/CVAs  . Hypothyroidism   . 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 C3 cervical fracture 07/2011    unhealed d/t osteoporosis  . CAD (coronary artery disease)     CABG 1988, last cath 2003 ~ distal disease per wife. Does not want any more caths.      Most Recent Cardiac Studies: 2D Echo 09/09/12 Impressions: - Mildly dilated LV with moderately reduced systolic function, EF 35%. Unusual wall motion pattern with good periapical wall motion but hypokinesis of the basal to mid wall LV segments. Cannot rule out a reverse Takotsubo pattern. Moderate diastolic dysfunction. Aortic sclerosis without stenosis. Heavily calcified mitral annulus without significant stenosis. Cannot rule out prior mitral valve repair. There was moderate to severe mitral regurgitation. Moderate pulmonary hypertension. Normal RV size and systolic function.   Surgical History:  Past Surgical History  Procedure Laterality Date  . Cardiac surgery    . Leg surgery Left      Home Meds: Prior to Admission medications   Medication Sig Start Date End Date Taking? Authorizing Provider  acetaminophen (TYLENOL) 650 MG CR tablet Take 1,300 mg by mouth every 8 (eight) hours as needed for pain.   Yes Historical Provider, MD  aspirin 325 MG tablet Take 325 mg by mouth daily.   Yes Historical Provider, MD  citalopram (CELEXA) 10 MG tablet Take 10 mg  by mouth daily.   Yes Historical Provider, MD  furosemide (LASIX) 40 MG tablet Take 40 mg by mouth 2 (two) times daily.   Yes Historical Provider, MD  Hypromellose (ISOPTO TEARS) 0.5 % SOLN Place 1 drop into both eyes daily.   Yes Historical Provider, MD  insulin NPH (HUMULIN N,NOVOLIN N) 100 UNIT/ML  injection Inject 20 Units into the skin every morning.   Yes Historical Provider, MD  isosorbide mononitrate (IMDUR) 30 MG 24 hr tablet Take 30 mg by mouth daily.   Yes Historical Provider, MD  levothyroxine (SYNTHROID, LEVOTHROID) 100 MCG tablet Take 100 mcg by mouth daily before breakfast.   Yes Historical Provider, MD  lidocaine (LIDODERM) 5 % Place 1 patch onto the skin daily. Applies to right hip daily.  Remove & Discard patch within 12 hours or as directed by MD   Yes Historical Provider, MD  metoprolol tartrate (LOPRESSOR) 25 MG tablet Take 12.5 mg by mouth 2 (two) times daily.   Yes Historical Provider, MD  nystatin (MYCOSTATIN) 100000 UNIT/ML suspension Take 500,000 Units by mouth 4 (four) times daily. Takes for 7 days.  First dose given 09/03/2012.   Yes Historical Provider, MD  senna-docusate (SENOKOT-S) 8.6-50 MG per tablet Take 1 tablet by mouth 2 (two) times daily.   Yes Historical Provider, MD  simvastatin (ZOCOR) 40 MG tablet Take 40 mg by mouth at bedtime.   Yes Historical Provider, MD  sulfamethoxazole-trimethoprim (BACTRIM DS) 800-160 MG per tablet Take 1 tablet by mouth every 12 (twelve) hours.   Yes Historical Provider, MD  tamsulosin (FLOMAX) 0.4 MG CAPS Take 0.4 mg by mouth daily.   Yes Historical Provider, MD  TIMOLOL MALEATE OP Place 1 drop into the left eye at bedtime.   Yes Historical Provider, MD  nitroGLYCERIN (NITROSTAT) 0.4 MG SL tablet Place 0.4 mg under the tongue every 5 (five) minutes as needed for chest pain.    Historical Provider, MD    Inpatient Medications:  . antiseptic oral rinse  15 mL Mouth Rinse BID  . aspirin  300 mg Rectal Daily   Or  . aspirin  325 mg Oral Daily  . heparin  5,000 Units Subcutaneous Q8H  . insulin aspart  0-9 Units Subcutaneous Q4H  . levothyroxine  50 mcg Intravenous QAC breakfast  . lidocaine  1 patch Transdermal Q24H  . polyvinyl alcohol  1 drop Both Eyes Daily  . timolol  1 drop Left Eye Daily    Allergies:  Allergies    Allergen Reactions  . Fentanyl   . Aggrenox (Aspirin-Dipyridamole Er) Other (See Comments)    REACTION:  unknown  . Prinivil (Lisinopril) Other (See Comments)    REACTION: unknown    History   Social History  . Marital Status: Married    Spouse Name: N/A    Number of Children: N/A  . Years of Education: N/A   Occupational History  . Not on file.   Social History Main Topics  . Smoking status: Never Smoker   . Smokeless tobacco: Not on file  . Alcohol Use: No  . Drug Use: No  . Sexually Active: Not on file   Other Topics Concern  . Not on file   Social History Narrative  . No narrative on file     Family History: Patient does not know   Review of Systems: General: negative for chills, fever Cardiovascular: negative for chest pain, edema, orthopnea Dermatological: negative for rash Respiratory: negative for cough or wheezing Urologic: negative  for hematuria Abdominal: negative for nausea, vomiting, diarrhea, bright red blood per rectum, melena, or hematemesis Neurologic: negative for visual changes, syncope All other systems reviewed and are otherwise negative except as noted above.  Labs:  Recent Labs  09/08/12 1343 09/08/12 1722 09/08/12 2229 09/09/12 0530  TROPONINI 0.32* <0.30 <0.30 <0.30   Lab Results  Component Value Date   WBC 4.6 09/09/2012   HGB 9.6* 09/09/2012   HCT 28.3* 09/09/2012   MCV 80.2 09/09/2012   PLT 304 09/09/2012    Recent Labs Lab 09/09/12 0530  NA 132*  K 3.7  CL 98  CO2 28  BUN 18  CREATININE 0.89  CALCIUM 8.1*  PROT 5.5*  BILITOT 0.2*  ALKPHOS 75  ALT 16  AST 23  GLUCOSE 91   Lab Results  Component Value Date   CHOL 136 09/09/2012   HDL 49 09/09/2012   LDLCALC 66 09/09/2012   TRIG 104 09/09/2012   No results found for this basename: DDIMER    Radiology/Studies:   Dg Chest 2 View  09/08/2012  *RADIOLOGY REPORT*  Clinical Data: Stroke, diabetic  CHEST - 2 VIEW  Comparison: None.  Findings: There are multilobar  patchy airspace opacities, most prominent at the lung bases, with small pleural effusions.  Kerley B lines are present.  Mild cardiomegaly noted.  Central vascular congestion is present.  Evidence of prior CABG.  Bilateral, right greater than left, glenohumeral degenerative change.  IMPRESSION: Constellation of findings suggests pulmonary edema.  Follow-up after diuresis may be helpful to exclude underlying pulmonary opacity.   Original Report Authenticated By: Christiana Pellant, M.D.    Ct Head Wo Contrast  09/08/2012  *RADIOLOGY REPORT*  Clinical Data: Right-sided weakness.  CT HEAD WITHOUT CONTRAST  Technique:  Contiguous axial images were obtained from the base of the skull through the vertex without contrast.  Comparison: None.  Findings: Bone windows demonstrate clear paranasal sinuses and mastoid air cells.  Soft tissue windows demonstrate expected cerebral atrophy. Moderate low density in the periventricular white matter likely related to small vessel disease.Ventriculomegaly, felt to be secondary to cerebral atrophy.  This is mild. No  mass lesion, hemorrhage, hydrocephalus, acute infarct, intra-axial, or extra- axial fluid collection.  IMPRESSION: Cerebral atrophy and small vessel ischemic change, without acute intracranial abnormality. Report called to Dr. Thad Ranger at 1:52 p.m.   Original Report Authenticated By: Jeronimo Greaves, M.D.    Mr Brain Wo Contrast  09/09/2012  *RADIOLOGY REPORT*  Clinical Data:  79 year old diabetic patient presenting with episode of slurred speech.  History of cervical fracture post fall.  MRI BRAIN WITHOUT CONTRAST MRA HEAD WITHOUT CONTRAST  Technique: Multiplanar, multiecho pulse sequences of the brain and surrounding structures were obtained according to standard protocol without intravenous contrast.  Angiographic images of the head were obtained using MRA technique without contrast.  Comparison: 09/08/2012 head CT.  No other exams available for review.  MRI HEAD  Findings:   Mild motion degradation.  No acute infarct.  No intracranial hemorrhage.  Moderate small vessel disease type changes.  Global atrophy without hydrocephalus.  Major intracranial vascular structures are patent.  Bilateral mastoid air cells/middle ear opacification.  Minimal paranasal sinus mucosal thickening.  Abnormal appearance of the C2 vertebra.  This may be related to patient's report history of fracture with the surrounding soft tissue prominence representing reaction to the fracture and transverse ligament hypertrophy.  As the patient is diabetic and has gangrene of the toe, infection not excluded in the proper clinical setting.  This is incompletely assessed on present exam and is causing spinal stenosis and cord flattening.  IMPRESSION: No acute infarct.  Moderate small vessel disease type changes.  Global atrophy without hydrocephalus.  Bilateral mastoid air cells/middle ear opacification.  Minimal paranasal sinus mucosal thickening.  Abnormal appearance of the C2 vertebra.  This may be related to patient's report history of fracture with the surrounding soft tissue prominence representing reaction to the fracture and transverse ligament hypertrophy.  As the patient is diabetic and has gangrene of the toe, infection not excluded in the proper clinical setting.  This is incompletely assessed on present exam and is causing spinal stenosis and cord flattening.  MRA HEAD  Findings: Motion degraded exam.  This limits grading stenosis accurately and for evaluating for possibility of detecting small aneurysm.  Ectatic distal vertical cervical segment of the internal carotid arteries greater on the right.  Mild narrowing and irregularity cavernous segment of the internal carotid artery bilaterally with small bulges on the left probably related to atherosclerotic type changes rather than discrete aneurysm.  Moderate narrowing left-sided and mild narrowing right sided supraclinoid aspect of the internal carotid  artery.  Moderate narrowing left-sided and mild irregularity of right-sided A1 segment of the anterior cerebral artery.  Mild narrowing left M1 segment left middle cerebral artery.  Moderate narrowing middle cerebral artery branch vessels bilaterally with decreased number visualized right middle cerebral artery branches.  Left vertebral artery is dominant.  Mild narrowing distal right vertebral artery.  Mild to moderate narrowing proximal basilar artery.  Nonvisualization AICAs.  Moderate tandem stenoses superior cerebellar artery bilaterally and the proximal aspect posterior cerebral artery bilaterally with distal posterior cerebral artery moderate branch vessel irregularity bilaterally.  IMPRESSION: Intracranial atherosclerotic type changes as detailed above.  Exam is motion degraded.   Original Report Authenticated By: Lacy Duverney, M.D.    Mr Mra Head/brain Wo Cm  09/09/2012  *RADIOLOGY REPORT*  Clinical Data:  79 year old diabetic patient presenting with episode of slurred speech.  History of cervical fracture post fall.  MRI BRAIN WITHOUT CONTRAST MRA HEAD WITHOUT CONTRAST  Technique: Multiplanar, multiecho pulse sequences of the brain and surrounding structures were obtained according to standard protocol without intravenous contrast.  Angiographic images of the head were obtained using MRA technique without contrast.  Comparison: 09/08/2012 head CT.  No other exams available for review.  MRI HEAD  Findings:  Mild motion degradation.  No acute infarct.  No intracranial hemorrhage.  Moderate small vessel disease type changes.  Global atrophy without hydrocephalus.  Major intracranial vascular structures are patent.  Bilateral mastoid air cells/middle ear opacification.  Minimal paranasal sinus mucosal thickening.  Abnormal appearance of the C2 vertebra.  This may be related to patient's report history of fracture with the surrounding soft tissue prominence representing reaction to the fracture and transverse  ligament hypertrophy.  As the patient is diabetic and has gangrene of the toe, infection not excluded in the proper clinical setting.  This is incompletely assessed on present exam and is causing spinal stenosis and cord flattening.  IMPRESSION: No acute infarct.  Moderate small vessel disease type changes.  Global atrophy without hydrocephalus.  Bilateral mastoid air cells/middle ear opacification.  Minimal paranasal sinus mucosal thickening.  Abnormal appearance of the C2 vertebra.  This may be related to patient's report history of fracture with the surrounding soft tissue prominence representing reaction to the fracture and transverse ligament hypertrophy.  As the patient is diabetic and has gangrene of the toe, infection not excluded  in the proper clinical setting.  This is incompletely assessed on present exam and is causing spinal stenosis and cord flattening.  MRA HEAD  Findings: Motion degraded exam.  This limits grading stenosis accurately and for evaluating for possibility of detecting small aneurysm.  Ectatic distal vertical cervical segment of the internal carotid arteries greater on the right.  Mild narrowing and irregularity cavernous segment of the internal carotid artery bilaterally with small bulges on the left probably related to atherosclerotic type changes rather than discrete aneurysm.  Moderate narrowing left-sided and mild narrowing right sided supraclinoid aspect of the internal carotid artery.  Moderate narrowing left-sided and mild irregularity of right-sided A1 segment of the anterior cerebral artery.  Mild narrowing left M1 segment left middle cerebral artery.  Moderate narrowing middle cerebral artery branch vessels bilaterally with decreased number visualized right middle cerebral artery branches.  Left vertebral artery is dominant.  Mild narrowing distal right vertebral artery.  Mild to moderate narrowing proximal basilar artery.  Nonvisualization AICAs.  Moderate tandem stenoses  superior cerebellar artery bilaterally and the proximal aspect posterior cerebral artery bilaterally with distal posterior cerebral artery moderate branch vessel irregularity bilaterally.  IMPRESSION: Intracranial atherosclerotic type changes as detailed above.  Exam is motion degraded.   Original Report Authenticated By: Lacy Duverney, M.D.     EKG:  Initial: wide complex tachycardia 107bpm RBBB LAFB without clear p waves Followup: NSR 88bpm with 1st degree AVB occ PVCs, RBBB  Physical Exam: Blood pressure 142/68, pulse 103, temperature 98.2 F (36.8 C), temperature source Oral, resp. rate 20, height 5\' 8"  (1.727 m), weight 135 lb (61.236 kg), SpO2 98.00%. General: Well developed pale elderly WM in no acute distress laying flat in bed Head: Normocephalic, atraumatic, sclera non-icteric, no xanthomas, nares are without discharge.  Neck: wearing collar. Lungs: Clear bilaterally to auscultation without wheezes, rales, or rhonchi. Breathing is unlabored. Heart: RRR with S1 S2. 2/6 SEM appreciated. Abdomen: Soft, non-tender, non-distended with normoactive bowel sounds. No hepatomegaly. No rebound/guarding. No obvious abdominal masses. Msk:  Strength and tone appear normal for age. Extremities: No clubbing or cyanosis. No edema.  Distal pedal pulses are 2+ and equal bilaterally. Neuro: Alert and oriented to person, situation. Says he's at home. Doesn't know the date (wife says he never knows the date).. No facial asymmetry. No focal deficit. Moves all extremities spontaneously and follows commands). Psych:  Responds to questions appropriately with a normal affect.   Assessment and Plan:   1. AMS ?recurrent TIA vs hypoglycemia 2. Multiple TIAs/prior CVA 3. Longstanding history of atrial fibrillation (presumed paroxysmal) with RVR this admission, along with a slower WCT (?flutter), currently in NSR 4. Moderate-severe MR on 2D echo 5. Cardiomyopathy EF 35% by echo, unclear baseline 6. Remote  history of CAD s/p CABG 7. Frequent falls including recent cervical fracture 8. Diabetes mellitus with recent episodes of hypoglycemia in the hospital  Agree with avoiding anticoagulation given frequent falls. I am not sure if he should even be on Plavix as an outpatient given frequency of falls as previously suggested. While he is NPO, will use Lopressor 5mg  IV q6hr. Avoid diltiazem given low EF. Check TSH. He does not currently appear volume overloaded so will hold off on diuresis for now but would advise caution with IVF. CXR may represent something other than pulm edema as he is laying flat, compensated, not SOB in bed. Please turn off when felt appropriate. Strict I&O's and daily weights. See below for additional thoughts.  Signed, Ronie Spies PA-C  09/09/2012, 3:42 PM  Patient seen. Wife present in room and is a good historian. He has a history of paroxysmal atrial fibrillation but is no longer on eliquis because of frequent falls. Patient has past history of numerous TIAs in past. Today he went into rapid atrial fib and converted to NSR after cardizem 10 mg bolus.In view of his low EF we will use BB going forward. Physical exam does not suggest CHF.  Patient lying flat, no respiratory distress. Chest xray significantly abnormal may represent some pulmonary fibrosis.  Patient had been on amiodarone in the remote past. Agree with assessment and plan as noted above.

## 2012-09-09 NOTE — Progress Notes (Signed)
Stroke Team Progress Note  HISTORY Benjamin Dominguez is an 79 y.o. male who resides at Nursing home [address] [facility] [place] in [place]. Patient was last seen normal at 0720 by staff on 09/08/2012. At 0815 it w0as noted that patient had difficulty speaking and what they thought was right sided weakness. Of note: patient has some right arm weakness from previous C3 fracture which he currently is wearing C-collar. Wife was present at nursing home, and stated he usually has expressive difficulties and right sided weakness with previous TIA's but usually clears quickly. His symptoms today did not resolve as quickly as usual. Currently patient is still showing expressive aphasia, and right arm weakness. Initial CT head showed atrophy without acute infarct. Patient was not a TPA candidate secondary to delay in arrival. He was admitted for further evaluation and treatment.  SUBJECTIVE No family is at the bedside.  Overall he feels his condition is stable.   Per wife, patient's eliquis was stopped recently following a fall. She is concerned with ongoing stroke symptoms and that they may have made a wrong decision.   OBJECTIVE Most recent Vital Signs: Filed Vitals:   09/09/12 0300 09/09/12 0323 09/09/12 0655 09/09/12 1044  BP:  141/64 123/63 140/66  Pulse:  87 113 91  Temp:  97.9 F (36.6 C) 98.2 F (36.8 C) 98.2 F (36.8 C)  TempSrc: Oral Axillary Oral   Resp:  22 22 20   Height:      Weight:      SpO2:  99% 96% 98%   CBG (last 3)   Recent Labs  09/09/12 0359 09/09/12 1034 09/09/12 1129  GLUCAP 89 112* 137*    IV Fluid Intake:   . dextrose 5 % and 0.9% NaCl 50 mL/hr at 09/09/12 0120    MEDICATIONS  . aspirin  300 mg Rectal Daily   Or  . aspirin  325 mg Oral Daily  . heparin  5,000 Units Subcutaneous Q8H  . insulin aspart  0-9 Units Subcutaneous Q4H  . levothyroxine  50 mcg Intravenous QAC breakfast  . lidocaine  1 patch Transdermal Q24H  . polyvinyl alcohol  1 drop Both Eyes  Daily  . timolol  1 drop Left Eye Daily   PRN:  nitroGLYCERIN, ondansetron (ZOFRAN) IV  Diet:  NPO s Activity:  Up with assistance DVT Prophylaxis:  Heparin 5000 units sq tid   CLINICALLY SIGNIFICANT STUDIES Basic Metabolic Panel:  Recent Labs Lab 09/08/12 1338 09/08/12 1356 09/08/12 1722 09/09/12 0530  NA 127* 130*  --  132*  K 4.1 4.2  --  3.7  CL 92* 93*  --  98  CO2 30  --   --  28  GLUCOSE 304* 303*  --  91  BUN 24* 25*  --  18  CREATININE 1.07 1.20 1.01 0.89  CALCIUM 8.0*  --   --  8.1*   Liver Function Tests:  Recent Labs Lab 09/08/12 1338 09/09/12 0530  AST 27 23  ALT 18 16  ALKPHOS 77 75  BILITOT 0.1* 0.2*  PROT 5.5* 5.5*  ALBUMIN 2.1* 2.1*   CBC:  Recent Labs Lab 09/08/12 1338  09/08/12 1722 09/09/12 0530  WBC 3.8*  --  3.6* 4.6  NEUTROABS 2.4  --   --   --   HGB 9.7*  < > 9.8* 9.6*  HCT 28.6*  < > 28.2* 28.3*  MCV 81.7  --  80.3 80.2  PLT 250  --  282 304  < > =  values in this interval not displayed. Coagulation:  Recent Labs Lab 09/08/12 1338  LABPROT 12.7  INR 0.96   Cardiac Enzymes:  Recent Labs Lab 09/08/12 1722 09/08/12 2229 09/09/12 0530  TROPONINI <0.30 <0.30 <0.30   Urinalysis:  Recent Labs Lab 09/08/12 1430  COLORURINE YELLOW  LABSPEC 1.017  PHURINE 6.0  GLUCOSEU >1000*  HGBUR LARGE*  BILIRUBINUR NEGATIVE  KETONESUR NEGATIVE  PROTEINUR NEGATIVE  UROBILINOGEN 0.2  NITRITE NEGATIVE  LEUKOCYTESUR NEGATIVE   Lipid Panel    Component Value Date/Time   CHOL 136 09/09/2012 0530   TRIG 104 09/09/2012 0530   HDL 49 09/09/2012 0530   CHOLHDL 2.8 09/09/2012 0530   VLDL 21 09/09/2012 0530   LDLCALC 66 09/09/2012 0530   HgbA1C  Lab Results  Component Value Date   HGBA1C 8.2* 09/09/2012    Urine Drug Screen:     Component Value Date/Time   LABOPIA NONE DETECTED 09/08/2012 1430   COCAINSCRNUR NONE DETECTED 09/08/2012 1430   LABBENZ NONE DETECTED 09/08/2012 1430   AMPHETMU NONE DETECTED 09/08/2012 1430   THCU NONE  DETECTED 09/08/2012 1430   LABBARB NONE DETECTED 09/08/2012 1430    Alcohol Level:  Recent Labs Lab 09/08/12 1338  ETH <11   CT of the brain  09/08/2012   Cerebral atrophy and small vessel ischemic change, without acute intracranial abnormality.   MRI of the brain  09/09/2012   No acute infarct.  Moderate small vessel disease type changes.  Global atrophy without hydrocephalus.  Bilateral mastoid air cells/middle ear opacification.  Minimal paranasal sinus mucosal thickening.  Abnormal appearance of the C2 vertebra.  This may be related to patient's report history of fracture with the surrounding soft tissue prominence representing reaction to the fracture and transverse ligament hypertrophy.  As the patient is diabetic and has gangrene of the toe, infection not excluded in the proper clinical setting.  This is incompletely assessed on present exam and is causing spinal stenosis and cord flattening.     MRA of the brain  09/09/2012 Intracranial atherosclerotic type changes as detailed above.  Exam is motion degraded  2D Echocardiogram    Carotid Doppler    CXR  09/08/2012 Constellation of findings suggests pulmonary edema.  Follow-up after diuresis may be helpful to exclude underlying pulmonary opacity.   EKG  Wide complex tachycardia.   Therapy Recommendations   Physical Exam   Frail elderly male not in distress.Awake alert. Afebrile. Head is nontraumatic. Neck is supple without bruit. Hearing is normal. Cardiac exam no murmur or gallop. Lungs are clear to auscultation. Distal pulses are well felt. Neurological exam ; awake alert oriented x3 with normal speech and language. Pupils irregular reactive. Fundi are not visualized. Vision acuity and fields appear normal. Face is symmetric. Tongue is midline. Motor system exam revealed no upper or lower extremity drift. Mild diminished right grip and intrinsic hand movements. Orbits left-over-right upper extremity. No lower extremity focal weakness.  Sensation and coordination symmetric. Gait was not tested. ASSESSMENT Mr. Benjamin Dominguez is a 79 y.o. male presenting with difficulty speak and right sided weakness. Imaging confirms no acute stroke. Symptoms felt to be worsening of old stroke deficits in setting of medical instability/dehydration. On aspirin 325 mg orally every day prior to admission. Now on aspirin 325 mg orally every day for secondary stroke prevention. Patient with no new stroke symptoms.   Diabetes, HgbA1c 8.2, goal < 7.0 LDL 66 Hx stroke, TIA Prior to admission C3 cervical fracture with C collar on Recently  eliquis changed to aspirin due to fall risk indicated for eliquis not reported.  Hospital day # 1  TREATMENT/PLAN  Continue aspirin 325 mg orally every day for secondary stroke prevention. If patient has atrial fibrillation, recommend aspirin 81 mg and plavix 75 mg for secondary stroke prevention as patient is not an anticoagulation candidate due to falls.  Stroke workup not indicated. Will cancel associated tests not already performed.  Increase IVF to 70 cc/hr Ongoing risk factor control by Primary Care Physician Stroke Service will sign off. Please call should any needs arise.  Annie Main, MSN, RN, ANVP-BC, ANP-BC, [facility] Pager: [phone] 09/09/2012 11:34 AM  I have personally obtained a history, examined the patient, evaluated imaging results, and formulated the assessment and plan of care. I agree with the above. Delia Heady, MD

## 2012-09-10 DIAGNOSIS — I214 Non-ST elevation (NSTEMI) myocardial infarction: Secondary | ICD-10-CM

## 2012-09-10 LAB — GLUCOSE, CAPILLARY
Glucose-Capillary: 216 mg/dL — ABNORMAL HIGH (ref 70–99)
Glucose-Capillary: 229 mg/dL — ABNORMAL HIGH (ref 70–99)
Glucose-Capillary: 422 mg/dL — ABNORMAL HIGH (ref 70–99)
Glucose-Capillary: 440 mg/dL — ABNORMAL HIGH (ref 70–99)
Glucose-Capillary: 67 mg/dL — ABNORMAL LOW (ref 70–99)

## 2012-09-10 LAB — GLUCOSE, RANDOM: Glucose, Bld: 416 mg/dL — ABNORMAL HIGH (ref 70–99)

## 2012-09-10 LAB — TSH: TSH: 4.837 u[IU]/mL — ABNORMAL HIGH (ref 0.350–4.500)

## 2012-09-10 MED ORDER — INSULIN GLARGINE 100 UNIT/ML ~~LOC~~ SOLN
15.0000 [IU] | Freq: Every day | SUBCUTANEOUS | Status: DC
  Administered 2012-09-10: 15 [IU] via SUBCUTANEOUS
  Filled 2012-09-10 (×2): qty 0.15

## 2012-09-10 MED ORDER — INSULIN ASPART 100 UNIT/ML ~~LOC~~ SOLN
15.0000 [IU] | Freq: Once | SUBCUTANEOUS | Status: AC
  Administered 2012-09-10: 15 [IU] via SUBCUTANEOUS

## 2012-09-10 MED ORDER — CADEXOMER IODINE 0.9 % EX GEL
CUTANEOUS | Status: DC
  Filled 2012-09-10: qty 40

## 2012-09-10 MED ORDER — INSULIN ASPART 100 UNIT/ML ~~LOC~~ SOLN
0.0000 [IU] | Freq: Three times a day (TID) | SUBCUTANEOUS | Status: DC
  Administered 2012-09-10: 5 [IU] via SUBCUTANEOUS
  Administered 2012-09-11: 15 [IU] via SUBCUTANEOUS
  Administered 2012-09-11: 8 [IU] via SUBCUTANEOUS
  Administered 2012-09-12: 15 [IU] via SUBCUTANEOUS

## 2012-09-10 MED ORDER — INSULIN ASPART 100 UNIT/ML ~~LOC~~ SOLN
4.0000 [IU] | Freq: Three times a day (TID) | SUBCUTANEOUS | Status: DC
  Administered 2012-09-10: 4 [IU] via SUBCUTANEOUS

## 2012-09-10 MED ORDER — LEVOTHYROXINE SODIUM 100 MCG PO TABS
100.0000 ug | ORAL_TABLET | Freq: Every day | ORAL | Status: DC
  Administered 2012-09-11 – 2012-09-12 (×2): 100 ug via ORAL
  Filled 2012-09-10 (×3): qty 1

## 2012-09-10 MED ORDER — ASPIRIN EC 81 MG PO TBEC
81.0000 mg | DELAYED_RELEASE_TABLET | Freq: Every day | ORAL | Status: DC
  Administered 2012-09-11 – 2012-09-12 (×2): 81 mg via ORAL
  Filled 2012-09-10 (×2): qty 1

## 2012-09-10 MED ORDER — CLOPIDOGREL BISULFATE 75 MG PO TABS
75.0000 mg | ORAL_TABLET | Freq: Every day | ORAL | Status: DC
  Administered 2012-09-10 – 2012-09-12 (×3): 75 mg via ORAL
  Filled 2012-09-10 (×4): qty 1

## 2012-09-10 MED ORDER — METOPROLOL TARTRATE 12.5 MG HALF TABLET
12.5000 mg | ORAL_TABLET | Freq: Two times a day (BID) | ORAL | Status: DC
  Administered 2012-09-10 – 2012-09-12 (×3): 12.5 mg via ORAL
  Filled 2012-09-10 (×6): qty 1

## 2012-09-10 MED ORDER — INSULIN ASPART 100 UNIT/ML ~~LOC~~ SOLN: 0.0000 [IU] | Freq: Every day | SUBCUTANEOUS | Status: DC

## 2012-09-10 NOTE — Progress Notes (Signed)
Patient's wife reports that patient is not "acting right". After assessment of patient, he seemed more confused-is now only oriented to self (instead of self and place), and speech seems more slurred. Pt's NIH increased from 6 to 7. CBG taken, resulting at 440. Other vitals stable at Temp 97.7, BP 130/65, P96, RR20, 100 2LNC. Dr. Isidoro Donning notified. Dr. Marjory Lies with neurology sent by Dr. Isidoro Donning to assess patient. New orders for Lantus and Novolog meal coverage added, also new orders for Aspirin 81mg  and Plavix 75mg  given. Will give meds as directed by pharmacy. Will continue to monitor.

## 2012-09-10 NOTE — Progress Notes (Signed)
Subjective:  The patient feels well this am.  No chest  Pain or dyspnea. Rhythm shows NSR on telemetry  Objective:  Vital Signs in the last 24 hours: Temp:  [97.7 F (36.5 C)-98 F (36.7 C)] 97.7 F (36.5 C) (04/12 1036) Pulse Rate:  [78-110] 90 (04/12 1036) Resp:  [18-20] 20 (04/12 1036) BP: (117-142)/(55-92) 117/55 mmHg (04/12 1036) SpO2:  [95 %-99 %] 99 % (04/12 1036)  Intake/Output from previous day: 04/11 0701 - 04/12 0700 In: -  Out: 1425 [Urine:1425] Intake/Output from this shift:    . antiseptic oral rinse  15 mL Mouth Rinse BID  . aspirin  300 mg Rectal Daily   Or  . aspirin  325 mg Oral Daily  . cadexomer iodine   Topical 3 times weekly  . heparin  5,000 Units Subcutaneous Q8H  . insulin aspart  0-15 Units Subcutaneous TID WC  . insulin aspart  0-5 Units Subcutaneous QHS  . [START ON 09/11/2012] levothyroxine  100 mcg Oral QAC breakfast  . lidocaine  1 patch Transdermal Q24H  . metoprolol tartrate  12.5 mg Oral BID  . polyvinyl alcohol  1 drop Both Eyes Daily  . timolol  1 drop Left Eye Daily      Physical Exam: The patient appears to be in no distress.  Head and neck exam reveals that the pupils are equal and reactive.  The extraocular movements are full.  There is no scleral icterus.  Mouth and pharynx are benign.  No lymphadenopathy.  No carotid bruits.  The jugular venous pressure is normal.  Thyroid is not enlarged or tender.  Chest is clear to percussion and auscultation.  No rales or rhonchi.  Expansion of the chest is symmetrical.  Heart reveals soft systolic murmur at base. Soft S3 gallop. No rub.  The abdomen is soft and nontender.  Bowel sounds are normoactive.  There is no hepatosplenomegaly or mass.  There are no abdominal bruits.  Extremities reveal no phlebitis or edema.  Pedal pulses are good.  There is no cyanosis or clubbing.  Neurologic exam alert,  Integument reveals no rash   Lab Results:  Recent Labs  09/08/12 1722  09/09/12 0530  WBC 3.6* 4.6  HGB 9.8* 9.6*  PLT 282 304    Recent Labs  09/08/12 1338 09/08/12 1356 09/08/12 1722 09/09/12 0530  NA 127* 130*  --  132*  K 4.1 4.2  --  3.7  CL 92* 93*  --  98  CO2 30  --   --  28  GLUCOSE 304* 303*  --  91  BUN 24* 25*  --  18  CREATININE 1.07 1.20 1.01 0.89    Recent Labs  09/09/12 0530 09/09/12 1538  TROPONINI <0.30 <0.30   Hepatic Function Panel  Recent Labs  09/09/12 0530  PROT 5.5*  ALBUMIN 2.1*  AST 23  ALT 16  ALKPHOS 75  BILITOT 0.2*    Recent Labs  09/09/12 0530  CHOL 136   No results found for this basename: PROTIME,  in the last 72 hours  Imaging: Imaging results have been reviewed  Cardiac Studies: Telemetry shows NSR with PACs Assessment/Plan:  1. AMS ?recurrent TIA vs hypoglycemia  2. Multiple TIAs/prior CVA  3. Longstanding history of atrial fibrillation (presumed paroxysmal) with RVR this admission, along with a slower WCT (?flutter), currently in NSR  4. Moderate-severe MR on 2D echo  5. Cardiomyopathy EF 35% by echo, unclear baseline  6. Remote history of CAD  s/p CABG  7. Frequent falls including recent cervical fracture  8. Diabetes mellitus with recent episodes of hypoglycemia in the hospital  Plan: Will change BB back to oral lopressor   LOS: 2 days    Cassell Clement 09/10/2012, 11:28 AM

## 2012-09-10 NOTE — Progress Notes (Addendum)
CRITICAL VALUE ALERT  Critical value received:  CBG 422  Date of notification:  09/10/12 Time of notification:  1215  Critical value read back: Yes  Nurse who received alert: Irene Pap, RN  MD notified (1st page):  Dr. Isidoro Donning  Time of first page:  1221  MD notified (2nd page): Dr, Isidoro Donning  Time of second page: 1235  Responding MD:  Dr. Isidoro Donning  Time MD responded:  1236  Orders given for 15units Novolog SQ once. Will continue to monitor.

## 2012-09-10 NOTE — Progress Notes (Signed)
Patient ID: Benjamin Dominguez  male  YQM:[id number]    DOB: 1923/05/04    DOA: 09/08/2012  PCP: Bosie Clos, MD  Assessment/Plan: Principal Problem:   Aphasia/ right-sided weakness: Fluctuating symptoms, TIA versus hypoglycemia/hyperglycemia versus dementia - MRI of the brain showed no acute infarct, abnormal appearance C2 vertebrae (the patient has a reported history of chronic C2 fracture, currently in C-collar) - 2-D echo showed an EF of 35%, grade 2 diastolic dysfunction hypokinesis of the basal to mid ventricle segments, cannot rule out prior mitral valve repair moderate, cannot rule out severe MR, no prior echo or stress test available - Passed swallow testing, restarted dysphagia 3 diet - Appreciate stroke service followup today, discussed with Dr. Rinaldo Cloud, recommended aspirin 81 mg daily with Plavix especially with his history of coronary disease/MI 2 weeks ago. He's not candidate of oral anticoagulation due to falls. - Patient also has significant atrophy on MRI and possible underlying dementia.  Active Problems:  Afib with RVR with Cardiomyopathy, MI 2 weeks ago: EF 35%, follows Dr Waldron Labs, was on eliquis which was dc'ed a week ago by Dr Waldron Labs because of falls (falls every 3 weeks per wife). He had an MI 2 weeks and was admitted at [facility]. He was then dc'ed to rehab at [facility] [place]. Patient was sent to [facility] for "stroke like symptoms" and had multiple TIA's in the past.      - Placed on aspirin and Plavix, currently normal sinus rhythm     Hypothyroidism -Change to oral Synthroid    Diabetes mellitus: Uncontrolled with hypo-and hyperglycemia episodes - Discontinue D5 drip - Placed on Lantus, sliding scale insulin and meal coverage  Chronic C2 Cervical spine fracture - Continue c-collar    Gangrene of foot- chronic RIGHT and ulcer on left - Foot x-rays were obtained, patient has OM on the right. Discussed in detail with patient's wife, who is aware of  osteomyelitis and has been taking care of this ulcer for the last year and states that patient follows in wound care every month, dressing changes every other day (she wants it done with iodosorb gel), does not want xeroform dressing changes. She states amputation was considered but due to his cardiac condition, was not recommended  - Will continue dressing changes.  DVT Prophylaxis:  Code Status:  Disposition: Was in the rehabilitation prior to this admission    Subjective: Fluctuating symptoms of dysarthria and weakness. Now having hyperglycemia episodes today  Objective: Weight change:   Intake/Output Summary (Last 24 hours) at 09/10/12 1455 Last data filed at 09/10/12 5284  Gross per 24 hour  Intake      0 ml  Output   1425 ml  Net  -1425 ml   Blood pressure 130/65, pulse 96, temperature 97.7 F (36.5 C), temperature source Oral, resp. rate 20, height 5\' 8"  (1.727 m), weight 61.236 kg (135 lb), SpO2 100.00%.  Physical Exam: General: Alert and awake, wife at the bedside,  CVS: S1-S2 clear, no murmur rubs or gallops Chest: clear to auscultation bilaterally, no wheezing, rales or rhonchi Abdomen: soft nontender, nondistended, normal bowel sounds, no organomegaly Extremities: Left great toe ulcer it appears clean, dressing reapplied                   Right foot ulcer + appears gangrenous but no purulent drainage   Lab Results: Basic Metabolic Panel:  Recent Labs Lab 09/08/12 1338 09/08/12 1356 09/08/12 1722 09/09/12 0530 09/10/12 1220  NA 127* 130*  --  132*  --   K 4.1 4.2  --  3.7  --   CL 92* 93*  --  98  --   CO2 30  --   --  28  --   GLUCOSE 304* 303*  --  91 416*  BUN 24* 25*  --  18  --   CREATININE 1.07 1.20 1.01 0.89  --   CALCIUM 8.0*  --   --  8.1*  --    Liver Function Tests:  Recent Labs Lab 09/08/12 1338 09/09/12 0530  AST 27 23  ALT 18 16  ALKPHOS 77 75  BILITOT 0.1* 0.2*  PROT 5.5* 5.5*  ALBUMIN 2.1* 2.1*   No results found for this  basename: LIPASE, AMYLASE,  in the last 168 hours No results found for this basename: AMMONIA,  in the last 168 hours CBC:  Recent Labs Lab 09/08/12 1338  09/08/12 1722 09/09/12 0530  WBC 3.8*  --  3.6* 4.6  NEUTROABS 2.4  --   --   --   HGB 9.7*  < > 9.8* 9.6*  HCT 28.6*  < > 28.2* 28.3*  MCV 81.7  --  80.3 80.2  PLT 250  --  282 304  < > = values in this interval not displayed. Cardiac Enzymes:  Recent Labs Lab 09/08/12 2229 09/09/12 0530 09/09/12 1538  TROPONINI <0.30 <0.30 <0.30   BNP: No components found with this basename: POCBNP,  CBG:  Recent Labs Lab 09/10/12 0551 09/10/12 0553 09/10/12 0645 09/10/12 0833 09/10/12 1208  GLUCAP 67* 63* 115* 178* 422*     Micro Results: No results found for this or any previous visit (from the past 240 hour(s)).  Studies/Results: Dg Chest 2 View  09/08/2012  *RADIOLOGY REPORT*  Clinical Data: Stroke, diabetic  CHEST - 2 VIEW  Comparison: None.  Findings: There are multilobar patchy airspace opacities, most prominent at the lung bases, with small pleural effusions.  Kerley B lines are present.  Mild cardiomegaly noted.  Central vascular congestion is present.  Evidence of prior CABG.  Bilateral, right greater than left, glenohumeral degenerative change.  IMPRESSION: Constellation of findings suggests pulmonary edema.  Follow-up after diuresis may be helpful to exclude underlying pulmonary opacity.   Original Report Authenticated By: Christiana Pellant, M.D.    Ct Head Wo Contrast  09/08/2012  *RADIOLOGY REPORT*  Clinical Data: Right-sided weakness.  CT HEAD WITHOUT CONTRAST  Technique:  Contiguous axial images were obtained from the base of the skull through the vertex without contrast.  Comparison: None.  Findings: Bone windows demonstrate clear paranasal sinuses and mastoid air cells.  Soft tissue windows demonstrate expected cerebral atrophy. Moderate low density in the periventricular white matter likely related to small vessel  disease.Ventriculomegaly, felt to be secondary to cerebral atrophy.  This is mild. No  mass lesion, hemorrhage, hydrocephalus, acute infarct, intra-axial, or extra- axial fluid collection.  IMPRESSION: Cerebral atrophy and small vessel ischemic change, without acute intracranial abnormality. Report called to Dr. Thad Ranger at 1:52 p.m.   Original Report Authenticated By: Jeronimo Greaves, M.D.    Mr Brain Wo Contrast  09/09/2012  *RADIOLOGY REPORT*  Clinical Data:  79 year old diabetic patient presenting with episode of slurred speech.  History of cervical fracture post fall.  MRI BRAIN WITHOUT CONTRAST MRA HEAD WITHOUT CONTRAST  Technique: Multiplanar, multiecho pulse sequences of the brain and surrounding structures were obtained according to standard protocol without intravenous contrast.  Angiographic images of the head were obtained using MRA technique  without contrast.  Comparison: 09/08/2012 head CT.  No other exams available for review.  MRI HEAD  Findings:  Mild motion degradation.  No acute infarct.  No intracranial hemorrhage.  Moderate small vessel disease type changes.  Global atrophy without hydrocephalus.  Major intracranial vascular structures are patent.  Bilateral mastoid air cells/middle ear opacification.  Minimal paranasal sinus mucosal thickening.  Abnormal appearance of the C2 vertebra.  This may be related to patient's report history of fracture with the surrounding soft tissue prominence representing reaction to the fracture and transverse ligament hypertrophy.  As the patient is diabetic and has gangrene of the toe, infection not excluded in the proper clinical setting.  This is incompletely assessed on present exam and is causing spinal stenosis and cord flattening.  IMPRESSION: No acute infarct.  Moderate small vessel disease type changes.  Global atrophy without hydrocephalus.  Bilateral mastoid air cells/middle ear opacification.  Minimal paranasal sinus mucosal thickening.  Abnormal  appearance of the C2 vertebra.  This may be related to patient's report history of fracture with the surrounding soft tissue prominence representing reaction to the fracture and transverse ligament hypertrophy.  As the patient is diabetic and has gangrene of the toe, infection not excluded in the proper clinical setting.  This is incompletely assessed on present exam and is causing spinal stenosis and cord flattening.  MRA HEAD  Findings: Motion degraded exam.  This limits grading stenosis accurately and for evaluating for possibility of detecting small aneurysm.  Ectatic distal vertical cervical segment of the internal carotid arteries greater on the right.  Mild narrowing and irregularity cavernous segment of the internal carotid artery bilaterally with small bulges on the left probably related to atherosclerotic type changes rather than discrete aneurysm.  Moderate narrowing left-sided and mild narrowing right sided supraclinoid aspect of the internal carotid artery.  Moderate narrowing left-sided and mild irregularity of right-sided A1 segment of the anterior cerebral artery.  Mild narrowing left M1 segment left middle cerebral artery.  Moderate narrowing middle cerebral artery branch vessels bilaterally with decreased number visualized right middle cerebral artery branches.  Left vertebral artery is dominant.  Mild narrowing distal right vertebral artery.  Mild to moderate narrowing proximal basilar artery.  Nonvisualization AICAs.  Moderate tandem stenoses superior cerebellar artery bilaterally and the proximal aspect posterior cerebral artery bilaterally with distal posterior cerebral artery moderate branch vessel irregularity bilaterally.  IMPRESSION: Intracranial atherosclerotic type changes as detailed above.  Exam is motion degraded.   Original Report Authenticated By: Lacy Duverney, M.D.    Mr Mra Head/brain Wo Cm  09/09/2012  *RADIOLOGY REPORT*  Clinical Data:  79 year old diabetic patient presenting  with episode of slurred speech.  History of cervical fracture post fall.  MRI BRAIN WITHOUT CONTRAST MRA HEAD WITHOUT CONTRAST  Technique: Multiplanar, multiecho pulse sequences of the brain and surrounding structures were obtained according to standard protocol without intravenous contrast.  Angiographic images of the head were obtained using MRA technique without contrast.  Comparison: 09/08/2012 head CT.  No other exams available for review.  MRI HEAD  Findings:  Mild motion degradation.  No acute infarct.  No intracranial hemorrhage.  Moderate small vessel disease type changes.  Global atrophy without hydrocephalus.  Major intracranial vascular structures are patent.  Bilateral mastoid air cells/middle ear opacification.  Minimal paranasal sinus mucosal thickening.  Abnormal appearance of the C2 vertebra.  This may be related to patient's report history of fracture with the surrounding soft tissue prominence representing reaction to the fracture and  transverse ligament hypertrophy.  As the patient is diabetic and has gangrene of the toe, infection not excluded in the proper clinical setting.  This is incompletely assessed on present exam and is causing spinal stenosis and cord flattening.  IMPRESSION: No acute infarct.  Moderate small vessel disease type changes.  Global atrophy without hydrocephalus.  Bilateral mastoid air cells/middle ear opacification.  Minimal paranasal sinus mucosal thickening.  Abnormal appearance of the C2 vertebra.  This may be related to patient's report history of fracture with the surrounding soft tissue prominence representing reaction to the fracture and transverse ligament hypertrophy.  As the patient is diabetic and has gangrene of the toe, infection not excluded in the proper clinical setting.  This is incompletely assessed on present exam and is causing spinal stenosis and cord flattening.  MRA HEAD  Findings: Motion degraded exam.  This limits grading stenosis accurately and  for evaluating for possibility of detecting small aneurysm.  Ectatic distal vertical cervical segment of the internal carotid arteries greater on the right.  Mild narrowing and irregularity cavernous segment of the internal carotid artery bilaterally with small bulges on the left probably related to atherosclerotic type changes rather than discrete aneurysm.  Moderate narrowing left-sided and mild narrowing right sided supraclinoid aspect of the internal carotid artery.  Moderate narrowing left-sided and mild irregularity of right-sided A1 segment of the anterior cerebral artery.  Mild narrowing left M1 segment left middle cerebral artery.  Moderate narrowing middle cerebral artery branch vessels bilaterally with decreased number visualized right middle cerebral artery branches.  Left vertebral artery is dominant.  Mild narrowing distal right vertebral artery.  Mild to moderate narrowing proximal basilar artery.  Nonvisualization AICAs.  Moderate tandem stenoses superior cerebellar artery bilaterally and the proximal aspect posterior cerebral artery bilaterally with distal posterior cerebral artery moderate branch vessel irregularity bilaterally.  IMPRESSION: Intracranial atherosclerotic type changes as detailed above.  Exam is motion degraded.   Original Report Authenticated By: Lacy Duverney, M.D.     Medications: Scheduled Meds: . antiseptic oral rinse  15 mL Mouth Rinse BID  . [START ON 09/11/2012] aspirin EC  81 mg Oral Daily  . cadexomer iodine   Topical 3 times weekly  . clopidogrel  75 mg Oral Q breakfast  . heparin  5,000 Units Subcutaneous Q8H  . insulin aspart  0-15 Units Subcutaneous TID WC  . insulin aspart  0-5 Units Subcutaneous QHS  . insulin aspart  15 Units Subcutaneous Once  . insulin aspart  4 Units Subcutaneous TID WC  . insulin glargine  15 Units Subcutaneous Daily  . [START ON 09/11/2012] levothyroxine  100 mcg Oral QAC breakfast  . lidocaine  1 patch Transdermal Q24H  .  metoprolol tartrate  12.5 mg Oral BID  . polyvinyl alcohol  1 drop Both Eyes Daily  . timolol  1 drop Left Eye Daily      LOS: 2 days   Andriea Hasegawa M.D. [facility] 09/10/2012, 2:55 PM Pager: [phone]  If 7PM-7AM, please contact night-coverage [email or web address] Password [email or web address]

## 2012-09-10 NOTE — Progress Notes (Signed)
Hypoglycemic Event  CBG: 64  Treatment: 15 GM carbohydrate snack  Symptoms: None  Follow-up CBG: Time:2150 CBG Result:79  Possible Reasons for Event: Unknown  Comments/MD notified:    Benjamin Dominguez  Remember to initiate Hypoglycemia Order Set & complete

## 2012-09-10 NOTE — Progress Notes (Signed)
Occupational Therapy Evaluation Patient Details Name: Benjamin Dominguez MRN: [id number] DOB: [birth year] Today's Date: 09/10/2012 Time: 4696-2952 OT Time Calculation (min): 51 min  OT Assessment / Plan / Recommendation Clinical Impression  79 yo s/p C3 fx @ 1 year ago. s/p MI @ 2 weeks ago. Undergoing rehab at [facility]. Experienced AMS, ? R sided weakness and difficulty speaking - admitted to [facility]. CT (-). Abnormal blood sugars. ?dementia. Pt confused throughout eval. Incontinent of BM x2. Pt unaware of incontinence. Pt does not demonstrate any focal weakness or sensory deficits. Discussed D/C plan with wife. Pt will need SNF for rehab and will possibly need 24/7 care after that. All further OT to be addressed at SNF.    OT Assessment  All further OT needs can be met in the next venue of care    Follow Up Recommendations  SNF    Barriers to Discharge      Equipment Recommendations  None recommended by OT    Recommendations for Other Services    Frequency    eval only   Precautions / Restrictions Precautions Precautions: Cervical;Fall Precaution Comments: skni integrity Required Braces or Orthoses: Cervical Brace Cervical Brace: Soft collar   Pertinent Vitals/Pain no apparent distress     ADL  Eating/Feeding: Supervision/safety Where Assessed - Eating/Feeding: Chair Grooming: Moderate assistance Where Assessed - Grooming: Supported sitting Upper Body Bathing: Minimal assistance Where Assessed - Upper Body Bathing: Supported sitting Lower Body Bathing: Maximal assistance Where Assessed - Lower Body Bathing: Supported sit to stand Upper Body Dressing: Minimal assistance Where Assessed - Upper Body Dressing: Unsupported sitting Lower Body Dressing: Moderate assistance Where Assessed - Lower Body Dressing: Supported sit to Pharmacist, hospital: Minimal assistance Toilet Transfer Method: Stand pivot Toileting - Clothing Manipulation and Hygiene: +1 Total assistance  (incontinenet of bowel) Where Assessed - Toileting Clothing Manipulation and Hygiene: Standing ADL Comments: Pt with functional decline with ADL.     OT Diagnosis: Generalized weakness;Cognitive deficits  OT Problem List: Decreased strength;Decreased activity tolerance;Decreased range of motion;Impaired balance (sitting and/or standing);Decreased cognition;Decreased safety awareness;Decreased knowledge of use of DME or AE;Pain OT Treatment Interventions:     OT Goals Acute Rehab OT Goals OT Goal Formulation:  (eval only)  Visit Information  Last OT Received On: 09/10/12 Assistance Needed: +1    Subjective Data      Prior Functioning     Home Living Lives With: Family Available Help at Discharge: Family;Available 24 hours/day Type of Home: Skilled Nursing Facility Home Access: Stairs to enter Additional Comments: Pt. chair lift at home for 5 entry steps. Prior Function Level of Independence: Needs assistance Needs Assistance: Bathing;Dressing;Feeding;Grooming;Toileting;Meal Prep;Light Housekeeping;Gait;Transfers Bath: Supervision/set-up Dressing: Minimal Feeding: Supervision/set-up Grooming: Supervision/set-up Light Housekeeping: Total Comments: Prior to heart attack 2 weeks ago, pt required S for ADL and was mod I with mobility. Pt admitted from [facility]. He was there for rehab. Communication Communication: HOH;Expressive difficulties Dominant Hand: Right         Vision/Perception Vision - History Baseline Vision: No visual deficits   Cognition  Cognition Overall Cognitive Status: History of cognitive impairments - further impaired Area of Impairment: Attention;Memory;Following commands;Safety/judgement;Awareness of errors;Awareness of deficits;Problem solving;Executive functioning Arousal/Alertness: Awake/alert Orientation Level: Disoriented to;Place;Time;Situation Behavior During Session: Flat affect Current Attention Level: Sustained Memory: Decreased  recall of precautions Memory Deficits: not able to recall place after 1 min delay Following Commands: Follows one step commands with increased time Safety/Judgement: Decreased awareness of need for assistance Awareness of Errors: Assistance required to identify  errors made Awareness of Deficits: intelectual awareness Problem Solving: mod A funcitonal basic Executive Functioning: impaired Cognition - Other Comments: Wife reports cognition is worse    Extremity/Trunk Assessment Right Upper Extremity Assessment RUE ROM/Strength/Tone: Deficits;Due to pain RUE ROM/Strength/Tone Deficits: Pt. 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 Left Upper Extremity Assessment LUE ROM/Strength/Tone: Deficits LUE ROM/Strength/Tone Deficits: generalized weakness Right Lower Extremity Assessment RLE ROM/Strength/Tone: Deficits Left Lower Extremity Assessment LLE ROM/Strength/Tone: Deficits Trunk Assessment Trunk Assessment: Kyphotic     Mobility Bed Mobility Bed Mobility: Supine to Sit Rolling Left: 4: Min assist Transfers Transfers: Sit to Stand;Stand to Sit Sit to Stand: 4: Min assist;From bed;With upper extremity assist Stand to Sit: 3: Mod assist;Other (comment) (uncontrolled descent to chair)     Exercise     Balance  min A   End of Session OT - End of Session Equipment Utilized During Treatment: Gait belt Activity Tolerance: Patient tolerated treatment well Patient left: in chair;with call bell/phone within reach;with family/visitor present Nurse Communication: Mobility status;Other (comment) (incontinenece)  GO     Benjamin Dominguez,Benjamin Dominguez 09/10/2012, 4:42 PM [facility], OTR/L  [phone] 09/10/2012

## 2012-09-10 NOTE — Progress Notes (Signed)
Stroke Team Progress Note  HISTORY Lucas Winograd is an 79 y.o. male who resides at Nursing home [address] [facility] [place] in [place]. Patient was last seen normal at 0720 by staff on 09/08/2012. At 0815 it w0as noted that patient had difficulty speaking and what they thought was right sided weakness. Of note: patient has some right arm weakness from previous C3 fracture which he currently is wearing C-collar. Wife was present at nursing home, and stated he usually has expressive difficulties and right sided weakness with previous TIA's but usually clears quickly. His symptoms today did not resolve as quickly as usual. Currently patient is still showing expressive aphasia, and right arm weakness. Initial CT head showed atrophy without acute infarct. Patient was not a TPA candidate secondary to delay in arrival. He was admitted for further evaluation and treatment.  SUBJECTIVE Patient having fluctating symptoms today. Slurred speech confusion. At present, BG ranging in 400's in spite of insulin treatment. Patient comfortable but confused, with slurred speech. RNs and wife at bedside.  OBJECTIVE Most recent Vital Signs: Filed Vitals:   09/10/12 0556 09/10/12 1036 09/10/12 1241 09/10/12 1345  BP: 128/92 117/55 112/48 130/65  Pulse: 83 90 93 96  Temp: 98 F (36.7 C) 97.7 F (36.5 C)  97.7 F (36.5 C)  TempSrc: Oral     Resp: 18 20  20   Height:      Weight:      SpO2: 95% 99%  100%   CBG (last 3)   Recent Labs  09/10/12 0645 09/10/12 0833 09/10/12 1208  GLUCAP 115* 178* 422*    IV Fluid Intake:      MEDICATIONS  . antiseptic oral rinse  15 mL Mouth Rinse BID  . [START ON 09/11/2012] aspirin EC  81 mg Oral Daily  . cadexomer iodine   Topical 3 times weekly  . clopidogrel  75 mg Oral Q breakfast  . heparin  5,000 Units Subcutaneous Q8H  . insulin aspart  0-15 Units Subcutaneous TID WC  . insulin aspart  0-5 Units Subcutaneous QHS  . insulin aspart  15 Units Subcutaneous Once  .  insulin aspart  4 Units Subcutaneous TID WC  . insulin glargine  15 Units Subcutaneous Daily  . [START ON 09/11/2012] levothyroxine  100 mcg Oral QAC breakfast  . lidocaine  1 patch Transdermal Q24H  . metoprolol tartrate  12.5 mg Oral BID  . polyvinyl alcohol  1 drop Both Eyes Daily  . timolol  1 drop Left Eye Daily   PRN:  antiseptic oral rinse, nitroGLYCERIN, ondansetron (ZOFRAN) IV, RESOURCE THICKENUP CLEAR  Diet:  Dysphagia s Activity:  Up with assistance DVT Prophylaxis:  Heparin 5000 units sq tid   CLINICALLY SIGNIFICANT STUDIES Basic Metabolic Panel:   Recent Labs Lab 09/08/12 1338 09/08/12 1356 09/08/12 1722 09/09/12 0530 09/10/12 1220  NA 127* 130*  --  132*  --   K 4.1 4.2  --  3.7  --   CL 92* 93*  --  98  --   CO2 30  --   --  28  --   GLUCOSE 304* 303*  --  91 416*  BUN 24* 25*  --  18  --   CREATININE 1.07 1.20 1.01 0.89  --   CALCIUM 8.0*  --   --  8.1*  --    Liver Function Tests:   Recent Labs Lab 09/08/12 1338 09/09/12 0530  AST 27 23  ALT 18 16  ALKPHOS 77 75  BILITOT 0.1* 0.2*  PROT 5.5* 5.5*  ALBUMIN 2.1* 2.1*   CBC:  Recent Labs Lab 09/08/12 1338  09/08/12 1722 09/09/12 0530  WBC 3.8*  --  3.6* 4.6  NEUTROABS 2.4  --   --   --   HGB 9.7*  < > 9.8* 9.6*  HCT 28.6*  < > 28.2* 28.3*  MCV 81.7  --  80.3 80.2  PLT 250  --  282 304  < > = values in this interval not displayed. Coagulation:   Recent Labs Lab 09/08/12 1338  LABPROT 12.7  INR 0.96   Cardiac Enzymes:   Recent Labs Lab 09/08/12 2229 09/09/12 0530 09/09/12 1538  TROPONINI <0.30 <0.30 <0.30   Urinalysis:   Recent Labs Lab 09/08/12 1430  COLORURINE YELLOW  LABSPEC 1.017  PHURINE 6.0  GLUCOSEU >1000*  HGBUR LARGE*  BILIRUBINUR NEGATIVE  KETONESUR NEGATIVE  PROTEINUR NEGATIVE  UROBILINOGEN 0.2  NITRITE NEGATIVE  LEUKOCYTESUR NEGATIVE   Lipid Panel    Component Value Date/Time   CHOL 136 09/09/2012 0530   TRIG 104 09/09/2012 0530   HDL 49 09/09/2012  0530   CHOLHDL 2.8 09/09/2012 0530   VLDL 21 09/09/2012 0530   LDLCALC 66 09/09/2012 0530   HgbA1C  Lab Results  Component Value Date   HGBA1C 8.2* 09/09/2012    Urine Drug Screen:     Component Value Date/Time   LABOPIA NONE DETECTED 09/08/2012 1430   COCAINSCRNUR NONE DETECTED 09/08/2012 1430   LABBENZ NONE DETECTED 09/08/2012 1430   AMPHETMU NONE DETECTED 09/08/2012 1430   THCU NONE DETECTED 09/08/2012 1430   LABBARB NONE DETECTED 09/08/2012 1430    Alcohol Level:   Recent Labs Lab 09/08/12 1338  ETH <11   CT of the brain  09/08/2012   Cerebral atrophy and small vessel ischemic change, without acute intracranial abnormality.   MRI of the brain  09/09/2012   No acute infarct.  Moderate small vessel disease type changes.  Global atrophy without hydrocephalus.  Bilateral mastoid air cells/middle ear opacification.  Minimal paranasal sinus mucosal thickening.  Abnormal appearance of the C2 vertebra.  This may be related to patient's report history of fracture with the surrounding soft tissue prominence representing reaction to the fracture and transverse ligament hypertrophy.  As the patient is diabetic and has gangrene of the toe, infection not excluded in the proper clinical setting.  This is incompletely assessed on present exam and is causing spinal stenosis and cord flattening.     MRA of the brain  09/09/2012 Intracranial atherosclerotic type changes as detailed above.  Exam is motion degraded  2D Echocardiogram    Carotid Doppler    CXR  09/08/2012 Constellation of findings suggests pulmonary edema.  Follow-up after diuresis may be helpful to exclude underlying pulmonary opacity.   EKG  Wide complex tachycardia.   Therapy Recommendations   Physical Exam   Frail elderly male not in distress.Awake alert. Afebrile. Head is nontraumatic. Neck is supple without bruit. Hearing is normal. Cardiac exam no murmur or gallop. Lungs are clear to auscultation. Distal pulses are well  felt. Neurological exam ; awake alert; ORIENTED TO "JULY 23". SLURRED SPEECH. FOLLOWS COMMANDS. POSITIVE SNOUT AND PALMOMENTAL REFLEXES. Pupils REGULAR AND REACTIVE. Vision acuity and fields appear normal. Face is symmetric. Tongue is midline. Motor system exam revealed no upper or lower extremity drift. Mild diminished right grip and intrinsic hand movements. Orbits left-over-right upper extremity. No lower extremity focal weakness. Sensation and coordination symmetric. Gait was not tested.  ASSESSMENT Mr. Zaiden Ludlum is a 79 y.o. male presenting with difficulty speak and right sided weakness. Imaging confirms no acute stroke. Symptoms felt to be worsening of old stroke deficits in setting of medical instability/dehydration. On aspirin 325 mg orally every day prior to admission. Now on aspirin 325 mg orally every day for secondary stroke prevention. Patient with no new stroke symptoms. Patient has signs of dementia on exam, frontal release signs, confusion, and MRI shows moderate diffuse and severe mesial temporal atrophy. No acute findings.  Diabetes, HgbA1c 8.2, goal < 7.0 LDL 66 Hx stroke, TIA Prior to admission C3 cervical fracture with C collar on Recently eliquis changed to aspirin due to fall risk indicated for eliquis not reported.  Hospital day # 2  TREATMENT/PLAN Recommend aspirin 81 mg and plavix 75 mg for secondary stroke prevention as patient is not an anticoagulation candidate due to falls. Suspect that patient's ongoing fluctuating symptoms are related to underlying, undiagnosed dementia + hypo/hyperglycemia events, rather than TIA. In any event, limited options for stroke prevention given age and fall risk. Discussed with patient's wife.   Suanne Marker, MD 09/10/2012, 2:32 PM Certified in Neurology, Neurophysiology and Neuroimaging [facility] - Stroke Team  Please refer to [email or web address] for on-call Stroke MD

## 2012-09-11 DIAGNOSIS — IMO0002 Reserved for concepts with insufficient information to code with codable children: Secondary | ICD-10-CM

## 2012-09-11 DIAGNOSIS — E15 Nondiabetic hypoglycemic coma: Secondary | ICD-10-CM

## 2012-09-11 LAB — GLUCOSE, CAPILLARY
Glucose-Capillary: 63 mg/dL — ABNORMAL LOW (ref 70–99)
Glucose-Capillary: 72 mg/dL (ref 70–99)
Glucose-Capillary: 366 mg/dL — ABNORMAL HIGH (ref 70–99)
Glucose-Capillary: 274 mg/dL — ABNORMAL HIGH (ref 70–99)

## 2012-09-11 MED ORDER — DOXYCYCLINE HYCLATE 100 MG PO TABS
100.0000 mg | ORAL_TABLET | Freq: Two times a day (BID) | ORAL | Status: DC
  Administered 2012-09-11 – 2012-09-12 (×3): 100 mg via ORAL
  Filled 2012-09-11 (×4): qty 1

## 2012-09-11 MED ORDER — INSULIN GLARGINE 100 UNIT/ML ~~LOC~~ SOLN
10.0000 [IU] | Freq: Every day | SUBCUTANEOUS | Status: DC
  Administered 2012-09-11: 10 [IU] via SUBCUTANEOUS
  Filled 2012-09-11 (×2): qty 0.1

## 2012-09-11 MED ORDER — INSULIN GLARGINE 100 UNIT/ML ~~LOC~~ SOLN: 10.0000 [IU] | Freq: Every day | SUBCUTANEOUS | Status: DC

## 2012-09-11 NOTE — Progress Notes (Signed)
Patient ID: Benjamin Dominguez  male  OZH:[id number]    DOB: Apr 14, 1923    DOA: 09/08/2012  PCP: Bosie Clos, MD  Assessment/Plan: Principal Problem:   Aphasia/ right-sided weakness: Fluctuating symptoms, TIA versus hypoglycemia/hyperglycemia versus dementia - MRI of the brain showed no acute infarct, abnormal appearance C2 vertebrae (the patient has a reported history of chronic C2 fracture, currently in C-collar) - 2-D echo showed an EF of 35%, grade 2 diastolic dysfunction hypokinesis of the basal to mid ventricle segments, cannot rule out prior mitral valve repair moderate, cannot rule out severe MR, no prior echo or stress test available - Passed swallow testing, tolerating dysphagia 3 diet - Neurology following, recommended aspirin 81 mg daily with Plavix especially with his history of coronary disease/MI 2 weeks ago. He's not candidate of oral anticoagulation due to falls. - Carotid Dopplers not done as patient has chronic C2 fracture, not a candidate for spine surgery per his wife, states that: C-collar cannot come off. D/w Neurology/stroke service, who are aware, will defer to their recommendations  - Patient also has significant atrophy on MRI and possible underlying dementia.  Active Problems:  Afib with RVR with Cardiomyopathy, MI 2 weeks ago: EF 35%, follows Dr Waldron [facility], was on eliquis which was dc'ed a week ago by Dr Waldron [facility] because of falls (falls every 3 weeks per wife). He had an MI 2 weeks and was admitted at [facility]. He was then dc'ed to rehab at [facility] [place] [facility] [place]. Patient was sent to [facility] for "stroke like symptoms" and had multiple TIA's in the past.      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 Discussed in detail with patient's wife, who is aware of osteomyelitis and has been taking care of this ulcer for the last year and states that patient follows in wound care every month, dressing changes every other day (she wants it done with iodosorb gel), does not want xeroform dressing changes. She states amputation was considered but due to his cardiac condition, was not recommended  - Will continue dressing changes. Will add doxycycline to aid in healing.  DVT Prophylaxis:  Code Status:  Disposition: Was in the rehabilitation prior to this admission, DC to rehabilitation in AM Discussed with patient's wife in detail at bedside  Subjective: Eating without any difficulty with help of his wife  Objective: Weight change:   Intake/Output Summary (Last 24 hours) at 09/11/12 1037 Last data filed at 09/11/12 0924  Gross per 24 hour  Intake    240 ml  Output   1025 ml  Net   -785 ml   Blood pressure 141/65, pulse 93, temperature 97.9 F (36.6 C), temperature source Axillary, resp. rate 20, height 5\' 8"  (1.727 m), weight 64.014 kg (141 lb 2 oz), SpO2 100.00%.  Physical Exam: General: Alert and awake, wife at the bedside,  CVS: S1-S2 clear, no murmur rubs or gallops Chest: clear to auscultation bilaterally, no wheezing, rales or rhonchi Abdomen: soft nontender, nondistended, normal bowel sounds, no organomegaly  Extremities: Left great toe dressing                    Right foot dressing intact   Lab Results: Basic Metabolic Panel:  Recent Labs Lab 09/08/12 1338 09/08/12 1356 09/08/12 1722 09/09/12 0530 09/10/12 1220  NA 127* 130*  --  132*  --   K 4.1 4.2  --  3.7  --   CL 92* 93*  --  98  --   CO2 30   --   --  28  --   GLUCOSE 304* 303*  --  91 416*  BUN 24* 25*  --  18  --   CREATININE 1.07 1.20 1.01 0.89  --   CALCIUM 8.0*  --   --  8.1*  --    Liver Function Tests:  Recent Labs Lab 09/08/12 1338 09/09/12 0530  AST 27 23  ALT 18 16  ALKPHOS 77 75  BILITOT 0.1* 0.2*  PROT 5.5* 5.5*  ALBUMIN 2.1* 2.1*   No results found for this basename: LIPASE, AMYLASE,  in the last 168 hours No results found for this basename: AMMONIA,  in the last 168 hours CBC:  Recent Labs Lab 09/08/12 1338  09/08/12 1722 09/09/12 0530  WBC 3.8*  --  3.6* 4.6  NEUTROABS 2.4  --   --   --   HGB 9.7*  < > 9.8* 9.6*  HCT 28.6*  < > 28.2* 28.3*  MCV 81.7  --  80.3 80.2  PLT 250  --  282 304  < > = values in this interval not displayed. Cardiac Enzymes:  Recent Labs Lab 09/08/12 2229 09/09/12 0530 09/09/12 1538  TROPONINI <0.30 <0.30 <0.30   BNP: No components found with this basename: POCBNP,  CBG:  Recent Labs Lab 09/10/12 1639 09/10/12 2122 09/10/12 2158 09/11/12 0642 09/11/12 0703  GLUCAP 229* 64* 79 63* 72     Micro Results: No results found for this or any previous visit (from the past 240 hour(s)).  Studies/Results: Dg Chest 2 View  09/08/2012  *RADIOLOGY REPORT*  Clinical Data: Stroke, diabetic  CHEST - 2 VIEW  Comparison: None.  Findings: There are multilobar patchy airspace opacities, most prominent at the lung bases, with small pleural effusions.  Kerley B lines are present.  Mild cardiomegaly noted.  Central vascular congestion is present.  Evidence of prior CABG.  Bilateral, right greater than left, glenohumeral degenerative change.  IMPRESSION: Constellation of findings suggests pulmonary edema.  Follow-up after diuresis may be helpful to exclude underlying pulmonary opacity.   Original Report Authenticated By: Christiana Pellant, M.D.    Ct Head Wo Contrast  09/08/2012  *RADIOLOGY REPORT*  Clinical Data: Right-sided weakness.  CT HEAD WITHOUT CONTRAST  Technique:   Contiguous axial images were obtained from the base of the skull through the vertex without contrast.  Comparison: None.  Findings: Bone windows demonstrate clear paranasal sinuses and mastoid air cells.  Soft tissue windows demonstrate expected cerebral atrophy. Moderate low density in the periventricular white matter likely related to small vessel disease.Ventriculomegaly, felt to be secondary to cerebral atrophy.  This is mild. No  mass lesion, hemorrhage, hydrocephalus, acute infarct, intra-axial, or extra- axial fluid collection.  IMPRESSION: Cerebral atrophy and small vessel ischemic change, without acute intracranial abnormality. Report called to Dr. Thad Ranger at 1:52 p.m.   Original Report Authenticated By: Jeronimo Greaves, M.D.    Mr Brain Wo Contrast  09/09/2012  *RADIOLOGY REPORT*  Clinical Data:  79 year old  diabetic patient presenting with episode of slurred speech.  History of cervical fracture post fall.  MRI BRAIN WITHOUT CONTRAST MRA HEAD WITHOUT CONTRAST  Technique: Multiplanar, multiecho pulse sequences of the brain and surrounding structures were obtained according to standard protocol without intravenous contrast.  Angiographic images of the head were obtained using MRA technique without contrast.  Comparison: 09/08/2012 head CT.  No other exams available for review.  MRI HEAD  Findings:  Mild motion degradation.  No acute infarct.  No intracranial hemorrhage.  Moderate small vessel disease type changes.  Global atrophy without hydrocephalus.  Major intracranial vascular structures are patent.  Bilateral mastoid air cells/middle ear opacification.  Minimal paranasal sinus mucosal thickening.  Abnormal appearance of the C2 vertebra.  This may be related to patient's report history of fracture with the surrounding soft tissue prominence representing reaction to the fracture and transverse ligament hypertrophy.  As the patient is diabetic and has gangrene of the toe, infection not excluded in the  proper clinical setting.  This is incompletely assessed on present exam and is causing spinal stenosis and cord flattening.  IMPRESSION: No acute infarct.  Moderate small vessel disease type changes.  Global atrophy without hydrocephalus.  Bilateral mastoid air cells/middle ear opacification.  Minimal paranasal sinus mucosal thickening.  Abnormal appearance of the C2 vertebra.  This may be related to patient's report history of fracture with the surrounding soft tissue prominence representing reaction to the fracture and transverse ligament hypertrophy.  As the patient is diabetic and has gangrene of the toe, infection not excluded in the proper clinical setting.  This is incompletely assessed on present exam and is causing spinal stenosis and cord flattening.  MRA HEAD  Findings: Motion degraded exam.  This limits grading stenosis accurately and for evaluating for possibility of detecting small aneurysm.  Ectatic distal vertical cervical segment of the internal carotid arteries greater on the right.  Mild narrowing and irregularity cavernous segment of the internal carotid artery bilaterally with small bulges on the left probably related to atherosclerotic type changes rather than discrete aneurysm.  Moderate narrowing left-sided and mild narrowing right sided supraclinoid aspect of the internal carotid artery.  Moderate narrowing left-sided and mild irregularity of right-sided A1 segment of the anterior cerebral artery.  Mild narrowing left M1 segment left middle cerebral artery.  Moderate narrowing middle cerebral artery branch vessels bilaterally with decreased number visualized right middle cerebral artery branches.  Left vertebral artery is dominant.  Mild narrowing distal right vertebral artery.  Mild to moderate narrowing proximal basilar artery.  Nonvisualization AICAs.  Moderate tandem stenoses superior cerebellar artery bilaterally and the proximal aspect posterior cerebral artery bilaterally with distal  posterior cerebral artery moderate branch vessel irregularity bilaterally.  IMPRESSION: Intracranial atherosclerotic type changes as detailed above.  Exam is motion degraded.   Original Report Authenticated By: Lacy Duverney, M.D.    Mr Mra Head/brain Wo Cm  09/09/2012  *RADIOLOGY REPORT*  Clinical Data:  79 year old diabetic patient presenting with episode of slurred speech.  History of cervical fracture post fall.  MRI BRAIN WITHOUT CONTRAST MRA HEAD WITHOUT CONTRAST  Technique: Multiplanar, multiecho pulse sequences of the brain and surrounding structures were obtained according to standard protocol without intravenous contrast.  Angiographic images of the head were obtained using MRA technique without contrast.  Comparison: 09/08/2012 head CT.  No other exams available for review.  MRI HEAD  Findings:  Mild motion degradation.  No acute infarct.  No intracranial hemorrhage.  Moderate small vessel disease type  changes.  Global atrophy without hydrocephalus.  Major intracranial vascular structures are patent.  Bilateral mastoid air cells/middle ear opacification.  Minimal paranasal sinus mucosal thickening.  Abnormal appearance of the C2 vertebra.  This may be related to patient's report history of fracture with the surrounding soft tissue prominence representing reaction to the fracture and transverse ligament hypertrophy.  As the patient is diabetic and has gangrene of the toe, infection not excluded in the proper clinical setting.  This is incompletely assessed on present exam and is causing spinal stenosis and cord flattening.  IMPRESSION: No acute infarct.  Moderate small vessel disease type changes.  Global atrophy without hydrocephalus.  Bilateral mastoid air cells/middle ear opacification.  Minimal paranasal sinus mucosal thickening.  Abnormal appearance of the C2 vertebra.  This may be related to patient's report history of fracture with the surrounding soft tissue prominence representing reaction to  the fracture and transverse ligament hypertrophy.  As the patient is diabetic and has gangrene of the toe, infection not excluded in the proper clinical setting.  This is incompletely assessed on present exam and is causing spinal stenosis and cord flattening.  MRA HEAD  Findings: Motion degraded exam.  This limits grading stenosis accurately and for evaluating for possibility of detecting small aneurysm.  Ectatic distal vertical cervical segment of the internal carotid arteries greater on the right.  Mild narrowing and irregularity cavernous segment of the internal carotid artery bilaterally with small bulges on the left probably related to atherosclerotic type changes rather than discrete aneurysm.  Moderate narrowing left-sided and mild narrowing right sided supraclinoid aspect of the internal carotid artery.  Moderate narrowing left-sided and mild irregularity of right-sided A1 segment of the anterior cerebral artery.  Mild narrowing left M1 segment left middle cerebral artery.  Moderate narrowing middle cerebral artery branch vessels bilaterally with decreased number visualized right middle cerebral artery branches.  Left vertebral artery is dominant.  Mild narrowing distal right vertebral artery.  Mild to moderate narrowing proximal basilar artery.  Nonvisualization AICAs.  Moderate tandem stenoses superior cerebellar artery bilaterally and the proximal aspect posterior cerebral artery bilaterally with distal posterior cerebral artery moderate branch vessel irregularity bilaterally.  IMPRESSION: Intracranial atherosclerotic type changes as detailed above.  Exam is motion degraded.   Original Report Authenticated By: Lacy Duverney, M.D.     Medications: Scheduled Meds: . antiseptic oral rinse  15 mL Mouth Rinse BID  . aspirin EC  81 mg Oral Daily  . cadexomer iodine   Topical 3 times weekly  . clopidogrel  75 mg Oral Q breakfast  . heparin  5,000 Units Subcutaneous Q8H  . insulin aspart  0-15 Units  Subcutaneous TID WC  . insulin aspart  0-5 Units Subcutaneous QHS  . insulin glargine  10 Units Subcutaneous QHS  . levothyroxine  100 mcg Oral QAC breakfast  . lidocaine  1 patch Transdermal Q24H  . metoprolol tartrate  12.5 mg Oral BID  . polyvinyl alcohol  1 drop Both Eyes Daily  . timolol  1 drop Left Eye Daily      LOS: 3 days   Alexcia Schools M.D. [facility] 09/11/2012, 10:37 AM Pager: [phone]  If 7PM-7AM, please contact night-coverage [email or web address] Password [email or web address]

## 2012-09-11 NOTE — Progress Notes (Signed)
Subjective:  The patient feels well this am.  No chest  Pain or dyspnea. Rhythm shows NSR on telemetry.  No further choking spells with eating.  Objective:  Vital Signs in the last 24 hours: Temp:  [97.7 F (36.5 C)-98.7 F (37.1 C)] 98.5 F (36.9 C) (04/13 0525) Pulse Rate:  [86-96] 90 (04/13 0525) Resp:  [18-20] 20 (04/13 0525) BP: (107-153)/(45-72) 153/72 mmHg (04/13 0525) SpO2:  [97 %-100 %] 98 % (04/13 0525) Weight:  [141 lb 2 oz (64.014 kg)] 141 lb 2 oz (64.014 kg) (04/13 0500)  Intake/Output from previous day: 04/12 0701 - 04/13 0700 In: -  Out: 1025 [Urine:1025] Intake/Output from this shift: Total I/O In: 240 [P.O.:240] Out: -   . antiseptic oral rinse  15 mL Mouth Rinse BID  . aspirin EC  81 mg Oral Daily  . cadexomer iodine   Topical 3 times weekly  . clopidogrel  75 mg Oral Q breakfast  . heparin  5,000 Units Subcutaneous Q8H  . insulin aspart  0-15 Units Subcutaneous TID WC  . insulin aspart  0-5 Units Subcutaneous QHS  . insulin glargine  10 Units Subcutaneous QHS  . levothyroxine  100 mcg Oral QAC breakfast  . lidocaine  1 patch Transdermal Q24H  . metoprolol tartrate  12.5 mg Oral BID  . polyvinyl alcohol  1 drop Both Eyes Daily  . timolol  1 drop Left Eye Daily      Physical Exam: The patient appears to be in no distress.  Head and neck exam reveals that the pupils are equal and reactive.  The extraocular movements are full.  There is no scleral icterus.  Mouth and pharynx are benign.  No lymphadenopathy.  No carotid bruits.  The jugular venous pressure is normal.  Thyroid is not enlarged or tender.  Chest is clear to percussion and auscultation.  No rales or rhonchi.  Expansion of the chest is symmetrical.  Heart reveals soft systolic murmur at base. No S3 gallop. No rub.  The abdomen is soft and nontender.  Bowel sounds are normoactive.  There is no hepatosplenomegaly or mass.  There are no abdominal bruits.  Extremities reveal no phlebitis  or edema.  Pedal pulses are good.  There is no cyanosis or clubbing.  Neurologic exam alert,  Integument reveals no rash   Lab Results:  Recent Labs  09/08/12 1722 09/09/12 0530  WBC 3.6* 4.6  HGB 9.8* 9.6*  PLT 282 304    Recent Labs  09/08/12 1338 09/08/12 1356 09/08/12 1722 09/09/12 0530 09/10/12 1220  NA 127* 130*  --  132*  --   K 4.1 4.2  --  3.7  --   CL 92* 93*  --  98  --   CO2 30  --   --  28  --   GLUCOSE 304* 303*  --  91 416*  BUN 24* 25*  --  18  --   CREATININE 1.07 1.20 1.01 0.89  --     Recent Labs  09/09/12 0530 09/09/12 1538  TROPONINI <0.30 <0.30   Hepatic Function Panel  Recent Labs  09/09/12 0530  PROT 5.5*  ALBUMIN 2.1*  AST 23  ALT 16  ALKPHOS 75  BILITOT 0.2*    Recent Labs  09/09/12 0530  CHOL 136   No results found for this basename: PROTIME,  in the last 72 hours  Imaging: Imaging results have been reviewed  Cardiac Studies: Telemetry shows NSR with PACs.  No  further atrial fib. Assessment/Plan:  1. AMS ?recurrent TIA vs hypoglycemia  2. Multiple TIAs/prior CVA  3. Longstanding history of atrial fibrillation (presumed paroxysmal) with RVR this admission, along with a slower WCT (?flutter), currently in NSR  4. Moderate-severe MR on 2D echo  5. Cardiomyopathy EF 35% by echo, unclear baseline  6. Remote history of CAD s/p CABG  7. Frequent falls including recent cervical fracture  8. Diabetes mellitus with recent episodes of hypoglycemia in the hospital  Plan: Continue home dose of lopressor 12.5 mg BID Possible transfer back to [facility] Monday.   LOS: 3 days    Cassell Clement 09/11/2012, 9:42 AM

## 2012-09-11 NOTE — Progress Notes (Signed)
Stroke Team Progress Note  HISTORY Benjamin Dominguez is an 79 y.o. male who resides at Nursing home [address] [facility] [place] in [place]. Patient was last seen normal at 0720 by staff on 09/08/2012. At 0815 it was noted that patient had difficulty speaking and what they thought was right sided weakness. Of note: patient has some right arm weakness from previous C3 fracture which he currently is wearing C-collar. Wife was present at nursing home, and stated he usually has expressive difficulties and right sided weakness with previous TIA's but usually clears quickly. His symptoms today did not resolve as quickly as usual. Currently patient is still showing expressive aphasia, and right arm weakness. Initial CT head showed atrophy without acute infarct. Patient was not a TPA candidate secondary to delay in arrival. He was admitted for further evaluation and treatment.  SUBJECTIVE The patient's wife and the RN are at the bedside this morning. The patient has no complaints; although, he is not oriented. He is currently eating breakfast. The patient's wife has questions about discharge planning. I will ask the case manager to set up a time to meet with the patient's wife.  OBJECTIVE Most recent Vital Signs: Filed Vitals:   09/10/12 2119 09/11/12 0127 09/11/12 0500 09/11/12 0525  BP: 107/46 123/45  153/72  Pulse: 87 86  90  Temp: 98.7 F (37.1 C) 97.7 F (36.5 C)  98.5 F (36.9 C)  TempSrc: Oral Oral  Oral  Resp: 18 20  20   Height:      Weight:   64.014 kg (141 lb 2 oz)   SpO2: 97% 97%  98%   CBG (last 3)   Recent Labs  09/10/12 2158 09/11/12 0642 09/11/12 0703  GLUCAP 79 63* 72    IV Fluid Intake:      MEDICATIONS  . antiseptic oral rinse  15 mL Mouth Rinse BID  . aspirin EC  81 mg Oral Daily  . cadexomer iodine   Topical 3 times weekly  . clopidogrel  75 mg Oral Q breakfast  . heparin  5,000 Units Subcutaneous Q8H  . insulin aspart  0-15 Units Subcutaneous TID WC  . insulin  aspart  0-5 Units Subcutaneous QHS  . insulin aspart  4 Units Subcutaneous TID WC  . insulin glargine  15 Units Subcutaneous Daily  . levothyroxine  100 mcg Oral QAC breakfast  . lidocaine  1 patch Transdermal Q24H  . metoprolol tartrate  12.5 mg Oral BID  . polyvinyl alcohol  1 drop Both Eyes Daily  . timolol  1 drop Left Eye Daily   PRN:  antiseptic oral rinse, nitroGLYCERIN, ondansetron (ZOFRAN) IV, RESOURCE THICKENUP CLEAR  Diet:  Dysphagia 3 with nectar thick liquids. Activity:  Up with assistance DVT Prophylaxis:  Heparin 5000 units sq tid   CLINICALLY SIGNIFICANT STUDIES Basic Metabolic Panel:   Recent Labs Lab 09/08/12 1338 09/08/12 1356 09/08/12 1722 09/09/12 0530 09/10/12 1220  NA 127* 130*  --  132*  --   K 4.1 4.2  --  3.7  --   CL 92* 93*  --  98  --   CO2 30  --   --  28  --   GLUCOSE 304* 303*  --  91 416*  BUN 24* 25*  --  18  --   CREATININE 1.07 1.20 1.01 0.89  --   CALCIUM 8.0*  --   --  8.1*  --    Liver Function Tests:   Recent Labs Lab 09/08/12 1338  09/09/12 0530  AST 27 23  ALT 18 16  ALKPHOS 77 75  BILITOT 0.1* 0.2*  PROT 5.5* 5.5*  ALBUMIN 2.1* 2.1*   CBC:  Recent Labs Lab 09/08/12 1338  09/08/12 1722 09/09/12 0530  WBC 3.8*  --  3.6* 4.6  NEUTROABS 2.4  --   --   --   HGB 9.7*  < > 9.8* 9.6*  HCT 28.6*  < > 28.2* 28.3*  MCV 81.7  --  80.3 80.2  PLT 250  --  282 304  < > = values in this interval not displayed. Coagulation:   Recent Labs Lab 09/08/12 1338  LABPROT 12.7  INR 0.96   Cardiac Enzymes:   Recent Labs Lab 09/08/12 2229 09/09/12 0530 09/09/12 1538  TROPONINI <0.30 <0.30 <0.30   Urinalysis:   Recent Labs Lab 09/08/12 1430  COLORURINE YELLOW  LABSPEC 1.017  PHURINE 6.0  GLUCOSEU >1000*  HGBUR LARGE*  BILIRUBINUR NEGATIVE  KETONESUR NEGATIVE  PROTEINUR NEGATIVE  UROBILINOGEN 0.2  NITRITE NEGATIVE  LEUKOCYTESUR NEGATIVE   Lipid Panel    Component Value Date/Time   CHOL 136 09/09/2012 0530    TRIG 104 09/09/2012 0530   HDL 49 09/09/2012 0530   CHOLHDL 2.8 09/09/2012 0530   VLDL 21 09/09/2012 0530   LDLCALC 66 09/09/2012 0530   HgbA1C  Lab Results  Component Value Date   HGBA1C 8.2* 09/09/2012    Urine Drug Screen:     Component Value Date/Time   LABOPIA NONE DETECTED 09/08/2012 1430   COCAINSCRNUR NONE DETECTED 09/08/2012 1430   LABBENZ NONE DETECTED 09/08/2012 1430   AMPHETMU NONE DETECTED 09/08/2012 1430   THCU NONE DETECTED 09/08/2012 1430   LABBARB NONE DETECTED 09/08/2012 1430    Alcohol Level:   Recent Labs Lab 09/08/12 1338  ETH <11   CT of the brain  09/08/2012   Cerebral atrophy and small vessel ischemic change, without acute intracranial abnormality.   MRI of the brain  09/09/2012   No acute infarct.  Moderate small vessel disease type changes.  Global atrophy without hydrocephalus.  Bilateral mastoid air cells/middle ear opacification.  Minimal paranasal sinus mucosal thickening.  Abnormal appearance of the C2 vertebra.  This may be related to patient's report history of fracture with the surrounding soft tissue prominence representing reaction to the fracture and transverse ligament hypertrophy.  As the patient is diabetic and has gangrene of the toe, infection not excluded in the proper clinical setting.  This is incompletely assessed on present exam and is causing spinal stenosis and cord flattening.     MRA of the brain  09/09/2012 Intracranial atherosclerotic type changes as detailed above.  Exam is motion degraded  2D Echocardiogram  ejection fraction 35% with grade 2 diastolic dysfunction. No cardiac source of emboli was identified.  Carotid Doppler - patient currently wearing a soft cervical collar. The patient's wife states that this can be removed only for bathing the patient must hold his neck perfectly still. Hesitant to order carotid Dopplers secondary to previous C3 fracture.  CXR  09/08/2012 Constellation of findings suggests pulmonary edema.  Follow-up  after diuresis may be helpful to exclude underlying pulmonary opacity.   EKG  Wide complex tachycardia.   Therapy Recommendations -  skilled nursing facility  Physical Exam   General - pleasantly confused 79 year old male in no acute distress. Heart - Regular rate and rhythm - no murmer Lungs - Clear to auscultation Abdomen - Soft - non tender Extremities - Distal pulses weak  to absent with stasis changes - no edema Skin - Warm and dry  NEUROLOGIC:   MENTAL STATUS: awake, language slow with mild dysarthria.  Follows simple commands. Not oriented today. CRANIAL NERVES: pupils equal and reactive to light,extraocular muscles intact, facial sensation and strength symmetric, uvula midlinec, tongue midline MOTOR: normal bulk and tone, Strength - 4/5 - equal bilaterally SENSORY: normal and symmetric to light touch  COORDINATION: finger-nose-finger is slow with mild tremor but intact.   ASSESSMENT Mr. Richard Ritchey is a 79 y.o. male presenting with speech difficulties and right sided weakness. Imaging confirms no acute stroke. Symptoms felt to be worsening of old stroke deficits in setting of medical instability/dehydration. On aspirin 325 mg orally every day prior to admission. Now on aspirin 81 mg daily and Plavix 75 mg daily for secondary stroke prevention. Patient with no new stroke symptoms. Patient has signs of dementia on exam, frontal release signs, confusion, and MRI shows moderate diffuse and severe mesial temporal atrophy. No acute findings.  Diabetes, HgbA1c 8.2, goal < 7.0 LDL 66 Hx stroke, TIA Prior to admission C3 cervical fracture with C collar on Recently eliquis changed to aspirin due to fall risk indicated for eliquis not reported. Ejection fraction 35% by echo this admission. Afib history  Hospital day # 3  TREATMENT/PLAN Now on aspirin 81 mg and plavix 75 mg for secondary stroke prevention. Suspect that patient's ongoing fluctuating symptoms are related to  underlying, undiagnosed dementia + hypo/hyperglycemia events, rather than TIA. In any event, limited options for stroke prevention given age and fall risk. Discussed with patient's wife. Will not order carotid Dopplers due to history of C3 fracture. Have ordered social worker consult per patient's wife's request for discharge planning.  Delton See PA-C [facility] Pager [phone] 09/11/2012, 8:43 AM   Suanne Marker, MD 09/11/2012, 8:28 AM Certified in Neurology, Neurophysiology and Neuroimaging [facility] - Stroke Team  Please refer to [email or web address] for on-call Stroke MD

## 2012-09-11 NOTE — Progress Notes (Signed)
Hypoglycemic Event  CBG: 63  Treatment: 15 GM carbohydrate snack  Symptoms: None  Follow-up CBG: Time:0700 CBG Result:72  Possible Reasons for Event: Unknown  Comments/MD notified:    Alvira Philips  Remember to initiate Hypoglycemia Order Set & complete

## 2012-09-12 ENCOUNTER — Encounter (HOSPITAL_COMMUNITY): Payer: Self-pay | Admitting: Nurse Practitioner

## 2012-09-12 ENCOUNTER — Ambulatory Visit (HOSPITAL_COMMUNITY): Payer: MEDICARE

## 2012-09-12 LAB — GLUCOSE, CAPILLARY
Glucose-Capillary: 87 mg/dL (ref 70–99)
Glucose-Capillary: 363 mg/dL — ABNORMAL HIGH (ref 70–99)
Glucose-Capillary: 237 mg/dL — ABNORMAL HIGH (ref 70–99)

## 2012-09-12 MED ORDER — INSULIN ASPART 100 UNIT/ML ~~LOC~~ SOLN
0.0000 [IU] | Freq: Three times a day (TID) | SUBCUTANEOUS | Status: DC
  Administered 2012-09-12: 3 [IU] via SUBCUTANEOUS

## 2012-09-12 MED ORDER — INSULIN ASPART 100 UNIT/ML ~~LOC~~ SOLN: 0.0000 [IU] | Freq: Three times a day (TID) | SUBCUTANEOUS | Status: DC

## 2012-09-12 MED ORDER — DOXYCYCLINE HYCLATE 100 MG PO TABS: 100.0000 mg | ORAL_TABLET | Freq: Two times a day (BID) | ORAL | Status: AC

## 2012-09-12 MED ORDER — INSULIN GLARGINE 100 UNIT/ML ~~LOC~~ SOLN: 12.0000 [IU] | Freq: Every day | SUBCUTANEOUS | Status: DC

## 2012-09-12 MED ORDER — INSULIN ASPART 100 UNIT/ML ~~LOC~~ SOLN: 3.0000 [IU] | Freq: Three times a day (TID) | SUBCUTANEOUS | Status: AC

## 2012-09-12 MED ORDER — ASPIRIN 81 MG PO TBEC: 81.0000 mg | DELAYED_RELEASE_TABLET | Freq: Every day | ORAL | Status: AC

## 2012-09-12 MED ORDER — FUROSEMIDE 40 MG PO TABS: 40.0000 mg | ORAL_TABLET | Freq: Every day | ORAL | Status: AC

## 2012-09-12 MED ORDER — INSULIN GLARGINE 100 UNIT/ML ~~LOC~~ SOLN: 10.0000 [IU] | Freq: Every day | SUBCUTANEOUS | Status: AC

## 2012-09-12 MED ORDER — CADEXOMER IODINE 0.9 % EX GEL: CUTANEOUS | Status: AC

## 2012-09-12 MED ORDER — INSULIN ASPART 100 UNIT/ML ~~LOC~~ SOLN: 0.0000 [IU] | Freq: Three times a day (TID) | SUBCUTANEOUS | Status: AC

## 2012-09-12 MED ORDER — CLOPIDOGREL BISULFATE 75 MG PO TABS: 75.0000 mg | ORAL_TABLET | Freq: Every day | ORAL | Status: AC

## 2012-09-12 MED ORDER — INSULIN ASPART 100 UNIT/ML ~~LOC~~ SOLN
3.0000 [IU] | Freq: Three times a day (TID) | SUBCUTANEOUS | Status: DC
  Administered 2012-09-12: 3 [IU] via SUBCUTANEOUS

## 2012-09-12 MED ORDER — ACETAMINOPHEN 325 MG PO TABS
650.0000 mg | ORAL_TABLET | Freq: Four times a day (QID) | ORAL | Status: DC | PRN
  Administered 2012-09-12: 650 mg via ORAL
  Filled 2012-09-12 (×2): qty 2

## 2012-09-12 NOTE — Progress Notes (Signed)
SLP reviewed and agree with student findings.   Keshawna Dix MA, CCC-SLP [phone]    

## 2012-09-12 NOTE — Progress Notes (Signed)
09/12/12 1815 Pt education and discharge teaching complete. Pt transported to [facility] via [facility]. Report called to receiving nurse at [facility]. Wife notified of transport.

## 2012-09-12 NOTE — Progress Notes (Signed)
Subjective:  The patient feels well this am.  No chest  Pain or dyspnea. Rhythm shows NSR on telemetry.  No further choking spells with eating. Looking forward to possible transfer to SNF today.  Objective:  Vital Signs in the last 24 hours: Temp:  [97.7 F (36.5 C)-98.5 F (36.9 C)] 97.7 F (36.5 C) (04/14 0523) Pulse Rate:  [90-99] 93 (04/14 0523) Resp:  [14-22] 18 (04/14 0523) BP: (110-141)/(44-65) 125/44 mmHg (04/14 0523) SpO2:  [94 %-100 %] 96 % (04/14 0523) Weight:  [142 lb 7 oz (64.609 kg)] 142 lb 7 oz (64.609 kg) (04/14 0500)  Intake/Output from previous day: 04/13 0701 - 04/14 0700 In: 240 [P.O.:240] Out: 705 [Urine:705] Intake/Output from this shift:    . antiseptic oral rinse  15 mL Mouth Rinse BID  . aspirin EC  81 mg Oral Daily  . cadexomer iodine   Topical 3 times weekly  . clopidogrel  75 mg Oral Q breakfast  . doxycycline  100 mg Oral Q12H  . heparin  5,000 Units Subcutaneous Q8H  . insulin aspart  0-15 Units Subcutaneous TID WC  . insulin aspart  0-5 Units Subcutaneous QHS  . insulin glargine  10 Units Subcutaneous QHS  . levothyroxine  100 mcg Oral QAC breakfast  . lidocaine  1 patch Transdermal Q24H  . metoprolol tartrate  12.5 mg Oral BID  . polyvinyl alcohol  1 drop Both Eyes Daily  . timolol  1 drop Left Eye Daily      Physical Exam: The patient appears to be in no distress.  Head and neck exam reveals that the pupils are equal and reactive.  The extraocular movements are full.  There is no scleral icterus.  Mouth and pharynx are benign.  No lymphadenopathy.  No carotid bruits.  The jugular venous pressure is normal.  Thyroid is not enlarged or tender.  Chest is clear to percussion and auscultation.  No rales or rhonchi.  Expansion of the chest is symmetrical.  Heart reveals soft systolic murmur at base. No S3 gallop. No rub.  The abdomen is soft and nontender.  Bowel sounds are normoactive.  There is no hepatosplenomegaly or mass.  There are  no abdominal bruits.  Extremities reveal no phlebitis or edema.  Pedal pulses are good.  There is no cyanosis or clubbing.  Neurologic exam alert,  Integument reveals no rash   Lab Results: No results found for this basename: WBC, HGB, PLT,  in the last 72 hours  Recent Labs  09/10/12 1220  GLUCOSE 416*    Recent Labs  09/09/12 1538  TROPONINI <0.30   Hepatic Function Panel No results found for this basename: PROT, ALBUMIN, AST, ALT, ALKPHOS, BILITOT, BILIDIR, IBILI,  in the last 72 hours No results found for this basename: CHOL,  in the last 72 hours No results found for this basename: PROTIME,  in the last 72 hours  Imaging: Imaging results have been reviewed  Cardiac Studies: Telemetry shows NSR.  No further atrial fib. Assessment/Plan:  1. AMS ?recurrent TIA vs hypoglycemia  2. Multiple TIAs/prior CVA  3. Longstanding history of atrial fibrillation (presumed paroxysmal) with RVR this admission, along with a slower WCT (?flutter), currently in NSR  4. Moderate-severe MR on 2D echo  5. Cardiomyopathy EF 35% by echo, unclear baseline  6. Remote history of CAD s/p CABG  7. Frequent falls including recent cervical fracture  8. Diabetes mellitus with recent episodes of hypoglycemia in the hospital  Plan: Continue home  dose of lopressor 12.5 mg BID Possible transfer back to [facility] Monday.   LOS: 4 days    Cassell Clement 09/12/2012, 7:52 AM

## 2012-09-12 NOTE — Progress Notes (Signed)
Stroke Team Progress Note  HISTORY Benjamin Dominguez is an 79 y.o. male who resides at Nursing home [address] [facility] [place] in [place]. Patient was last seen normal at 0720 by staff on 09/08/2012. At 0815 it was noted that patient had difficulty speaking and what they thought was right sided weakness. Of note: patient has some right arm weakness from previous C3 fracture which he currently is wearing C-collar. Wife was present at nursing home, and stated he usually has expressive difficulties and right sided weakness with previous TIA's but usually clears quickly. His symptoms today did not resolve as quickly as usual. Currently patient is still showing expressive aphasia, and right arm weakness. Initial CT head showed atrophy without acute infarct. Patient was not a TPA candidate secondary to delay in arrival. He was admitted for further evaluation and treatment.  SUBJECTIVE Wife at bedside, washing his feet. Patient broke his C3 the third week of March 2013 - per her, the fracture is worsening. Followed by Dr. Wynetta Emery who reports the fx will not heal given severe osteoporosis.  OBJECTIVE Most recent Vital Signs: Filed Vitals:   09/11/12 2246 09/12/12 0100 09/12/12 0500 09/12/12 0523  BP: 110/52 122/60  125/44  Pulse: 93 90  93  Temp: 98 F (36.7 C) 98.2 F (36.8 C)  97.7 F (36.5 C)  TempSrc: Axillary Axillary  Axillary  Resp: 14 18  18   Height:      Weight:   64.609 kg (142 lb 7 oz)   SpO2: 98% 99%  96%   CBG (last 3)   Recent Labs  09/11/12 1640 09/11/12 2243 09/12/12 0631  GLUCAP 274* 124* 87   IV Fluid Intake:     MEDICATIONS  . antiseptic oral rinse  15 mL Mouth Rinse BID  . aspirin EC  81 mg Oral Daily  . cadexomer iodine   Topical 3 times weekly  . clopidogrel  75 mg Oral Q breakfast  . doxycycline  100 mg Oral Q12H  . heparin  5,000 Units Subcutaneous Q8H  . insulin aspart  0-15 Units Subcutaneous TID WC  . insulin aspart  0-5 Units Subcutaneous QHS  . insulin  glargine  10 Units Subcutaneous QHS  . levothyroxine  100 mcg Oral QAC breakfast  . lidocaine  1 patch Transdermal Q24H  . metoprolol tartrate  12.5 mg Oral BID  . polyvinyl alcohol  1 drop Both Eyes Daily  . timolol  1 drop Left Eye Daily   PRN:  acetaminophen, antiseptic oral rinse, nitroGLYCERIN, ondansetron (ZOFRAN) IV, RESOURCE THICKENUP CLEAR  Diet:  Dysphagia 3 with nectar thick liquids. Activity:  Up with assistance DVT Prophylaxis:  Heparin 5000 units sq tid   CLINICALLY SIGNIFICANT STUDIES Basic Metabolic Panel:   Recent Labs Lab 09/08/12 1338 09/08/12 1356 09/08/12 1722 09/09/12 0530 09/10/12 1220  NA 127* 130*  --  132*  --   K 4.1 4.2  --  3.7  --   CL 92* 93*  --  98  --   CO2 30  --   --  28  --   GLUCOSE 304* 303*  --  91 416*  BUN 24* 25*  --  18  --   CREATININE 1.07 1.20 1.01 0.89  --   CALCIUM 8.0*  --   --  8.1*  --    Liver Function Tests:   Recent Labs Lab 09/08/12 1338 09/09/12 0530  AST 27 23  ALT 18 16  ALKPHOS 77 75  BILITOT 0.1* 0.2*  PROT 5.5* 5.5*  ALBUMIN 2.1* 2.1*   CBC:  Recent Labs Lab 09/08/12 1338  09/08/12 1722 09/09/12 0530  WBC 3.8*  --  3.6* 4.6  NEUTROABS 2.4  --   --   --   HGB 9.7*  < > 9.8* 9.6*  HCT 28.6*  < > 28.2* 28.3*  MCV 81.7  --  80.3 80.2  PLT 250  --  282 304  < > = values in this interval not displayed. Coagulation:   Recent Labs Lab 09/08/12 1338  LABPROT 12.7  INR 0.96   Cardiac Enzymes:   Recent Labs Lab 09/08/12 2229 09/09/12 0530 09/09/12 1538  TROPONINI <0.30 <0.30 <0.30   Urinalysis:   Recent Labs Lab 09/08/12 1430  COLORURINE YELLOW  LABSPEC 1.017  PHURINE 6.0  GLUCOSEU >1000*  HGBUR LARGE*  BILIRUBINUR NEGATIVE  KETONESUR NEGATIVE  PROTEINUR NEGATIVE  UROBILINOGEN 0.2  NITRITE NEGATIVE  LEUKOCYTESUR NEGATIVE   Lipid Panel    Component Value Date/Time   CHOL 136 09/09/2012 0530   TRIG 104 09/09/2012 0530   HDL 49 09/09/2012 0530   CHOLHDL 2.8 09/09/2012 0530    VLDL 21 09/09/2012 0530   LDLCALC 66 09/09/2012 0530   HgbA1C  Lab Results  Component Value Date   HGBA1C 8.2* 09/09/2012    Urine Drug Screen:     Component Value Date/Time   LABOPIA NONE DETECTED 09/08/2012 1430   COCAINSCRNUR NONE DETECTED 09/08/2012 1430   LABBENZ NONE DETECTED 09/08/2012 1430   AMPHETMU NONE DETECTED 09/08/2012 1430   THCU NONE DETECTED 09/08/2012 1430   LABBARB NONE DETECTED 09/08/2012 1430    Alcohol Level:   Recent Labs Lab 09/08/12 1338  ETH <11   CT of the brain  09/08/2012   Cerebral atrophy and small vessel ischemic change, without acute intracranial abnormality.   MRI of the brain  09/09/2012   No acute infarct.  Moderate small vessel disease type changes.  Global atrophy without hydrocephalus.  Bilateral mastoid air cells/middle ear opacification.  Minimal paranasal sinus mucosal thickening.  Abnormal appearance of the C2 vertebra.  This may be related to patient's report history of fracture with the surrounding soft tissue prominence representing reaction to the fracture and transverse ligament hypertrophy.  As the patient is diabetic and has gangrene of the toe, infection not excluded in the proper clinical setting.  This is incompletely assessed on present exam and is causing spinal stenosis and cord flattening.     MRA of the brain  09/09/2012 Intracranial atherosclerotic type changes as detailed above.  Exam is motion degraded  2D Echocardiogram  ejection fraction 35% with grade 2 diastolic dysfunction. No cardiac source of emboli was identified.  Carotid Doppler - patient currently wearing a soft cervical collar. The patient's wife states that this can be removed only for bathing the patient must hold his neck perfectly still. Hesitant to order carotid Dopplers secondary to previous C3 fracture.  CXR  09/08/2012 Constellation of findings suggests pulmonary edema.  Follow-up after diuresis may be helpful to exclude underlying pulmonary opacity.   EKG  Wide  complex tachycardia.   Therapy Recommendations -  skilled nursing facility  Physical Exam  General: The patient is alert and cooperative at the time of the examination. The patient has a soft cervical collar.  Skin: No significant peripheral edema is noted.   Neurologic Exam  Cranial nerves: Facial symmetry is present. Speech is normal, no aphasia or dysarthria is noted. Extraocular movements are full. Visual fields are  full.  Motor: The patient has good strength in all 4 extremities, some restriction with pain of the right shoulder with arm abduction.  Coordination: The patient has good finger-nose-finger and heel-to-shin bilaterally.  Gait and station: The gait was not tested. No drift is seen.  Reflexes: Deep tendon reflexes are symmetric.    ASSESSMENT Mr. Fayette Hamada is a 79 y.o. male presenting with speech difficulties (similar episodes 40-50 times over the past 15 yrs per wife, lasting 24h without jerk/twitch) and right sided weakness (always weak per wife). Imaging confirms no acute stroke. Symptoms felt to be worsening of old stroke deficits in setting of medical instability/dehydration. On aspirin 325 mg orally every day prior to admission. Now on aspirin 81 mg daily and Plavix 75 mg daily for secondary stroke prevention. Patient with no new stroke symptoms. Patient has signs of dementia on exam, frontal release signs, confusion, and MRI shows moderate diffuse and severe mesial temporal atrophy. No acute findings.  Diabetes, HgbA1c 8.2, goal < 7.0 LDL 66 Hx stroke, TIA Prior to admission C3 cervical fracture with C collar on. fracture occurred the third week of march 2013.   Followed by Dr. Wynetta Emery who reports the fx will not heal given severe osteoporosis. Hx atrial fibrillation with RVR this adm, ? Flutter. Recently eliquis changed to aspirin due to fall risk indicated for eliquis not reported. CAD,  MI 3 wks ago, seen at [facility] Ejection fraction 35% by echo this  admission. Afib history  Will not order carotid Dopplers due to history of C3 fracture.  The patient has had 40 to 50 episodes of transient dysarthria lifetime over > 10 years. This last event was not associated with hypoglycemia.     Hospital day # 4  TREATMENT/PLAN Now on aspirin 81 mg and plavix 75 mg for secondary stroke prevention. Will check EEG for completeness Have ordered social worker consult per patient's wife's request for discharge planning.  Annie Main, MSN, RN, ANVP-BC, ANP-BC, [facility] Pager: [phone] 09/12/2012 10:55 AM  I have personally obtained a history, examined the patient, evaluated imaging results, and formulated the assessment and plan of care. I agree with the above. Lesly Dukes

## 2012-09-12 NOTE — Progress Notes (Signed)
Speech Language Pathology Dysphagia Treatment Patient Details Name: Benjamin Dominguez MRN: [id number] DOB: 1922-06-12 Today's Date: 09/12/2012 Time: 1420-1440 SLP Time Calculation (min): 20 min  Assessment / Plan / Recommendation Clinical Impression  Treatment focused on skilled observation of diet tolerance with therapeutic trials of thin liquids for possible diet adavancement. Pt with no overt s/s of aspiration w/nectar thick liquids or dys-3 diet, however pt with audible swallow indicative of delay in swallow initiation followed by immediate throat clear/cough and wet vocal quality with trials of thin liquds indicative of possible penetration/aspiration. Pt's wife reports that "choking" during meals/drinks is not new, however has been worse since current admission. SLP recommends MBS to objectively evaluate swallow function 4/15 a.m. Pt and wife (caretaker) in agreeance, however pt likely to d/c this p.m. If pt to d/c before MBS, SLP recommends pt f/u for MBS as outpatient. SLP recommends pt continue with current diet, results of MBS to determine if diet upgrade appropriate (if pt does not d/c).     Diet Recommendation  Continue with Current Diet: Dysphagia 3 (mechanical soft);Nectar-thick liquid    SLP Plan MBS   Pertinent Vitals/Pain None reported   Swallowing Goals  SLP Swallowing Goals Patient will consume recommended diet without observed clinical signs of aspiration with: Minimal assistance Swallow Study Goal #1 - Progress: Progressing toward goal Patient will utilize recommended strategies during swallow to increase swallowing safety with: Moderate assistance  General Temperature Spikes Noted: No Respiratory Status: Room air Behavior/Cognition: Alert;Cooperative;Pleasant mood;Hard of hearing Oral Cavity - Dentition: Dentures, bottom;Adequate natural dentition Patient Positioning: Upright in bed  Oral Cavity - Oral Hygiene     Dysphagia Treatment Treatment focused on: Skilled  observation of diet tolerance;Upgraded PO texture trials;Utilization of compensatory strategies;Patient/family/caregiver Dealer Educated: wife Treatment Methods/Modalities: Skilled observation Patient observed directly with PO's: Yes Type of PO's observed: Dysphagia 3 (soft);Dysphagia 1 (puree);Nectar-thick liquids;Thin liquids Liquids provided via: Cup Pharyngeal Phase Signs & Symptoms: Audible swallow;Suspected delayed swallow initiation;Immediate throat clear;Immediate cough;Wet vocal quality Type of cueing: Verbal;Visual Amount of cueing: Moderate   GO   Berdine Dance SLP student   Berdine Dance 09/12/2012, 3:49 PM

## 2012-09-12 NOTE — Evaluation (Signed)
SLP reviewed and agree with student findings.   Nusayba Cadenas MA, CCC-SLP [phone]    

## 2012-09-12 NOTE — Discharge Summary (Signed)
Physician Discharge Summary  Patient ID: Benjamin Dominguez MRN: [id number] DOB/AGE: 02-Oct-1922 79 y.o.  Admit date: 09/08/2012 Discharge date: 09/12/2012  Primary Care Physician:  Bosie Clos, MD  Discharge Diagnoses:    . Aphasia with right-sided weakness  . A. fib with RVR with cardiomyopathy, MI 2 weeks ago  . Hypothyroidism . Diabetes mellitus . Gangrene of foot; chronic right foot ulcer with chronic osteomyelitis and stage 1 on the left  . Hypoglycemia/hyperglycemia episodes with uncontrolled diabetes  . Atrial fibrillation . CAD (coronary artery disease)   Consults: Neurology                   Cardiology, Dr. Patty Sermons                   Diabetes coordinator                  Wound care   Discharge Instructions: 1) Please continue iodosorb dressings every other day to the right foot ulcer  2)   foam dressing to protect left foot and absorb drainage, change every 5 days or PRN soiling  3) please followup with Wound Care and Dr Lucienne Minks in [place] in 1 week 4) discharge diet: Dysphagia 3, Nectar thick, 2 g sodium, carb modified  Discharge Medications:   Medication List    STOP taking these medications       aspirin 325 MG tablet     insulin NPH 100 UNIT/ML injection  Commonly known as:  HUMULIN N,NOVOLIN N     isosorbide mononitrate 30 MG 24 hr tablet  Commonly known as:  IMDUR     sulfamethoxazole-trimethoprim 800-160 MG per tablet  Commonly known as:  BACTRIM DS      TAKE these medications       acetaminophen 650 MG CR tablet  Commonly known as:  TYLENOL  Take 1,300 mg by mouth every 8 (eight) hours as needed for pain.     aspirin 81 MG EC tablet  Take 1 tablet (81 mg total) by mouth daily.     cadexomer iodine 0.9 % gel  Commonly known as:  IODOSORB  Apply topically 3 (three) times a week. Apply to right foot ulcer every other day     citalopram 10 MG tablet  Commonly known as:  CELEXA  Take 10 mg by mouth daily.     clopidogrel 75 MG  tablet  Commonly known as:  PLAVIX  Take 1 tablet (75 mg total) by mouth daily with breakfast.     doxycycline 100 MG tablet  Commonly known as:  VIBRA-TABS  Take 1 tablet (100 mg total) by mouth 2 (two) times daily. X 2 weeks     furosemide 40 MG tablet  Commonly known as:  LASIX  Take 1 tablet (40 mg total) by mouth daily.     insulin aspart 100 UNIT/ML injection  Commonly known as:  novoLOG  Inject 0-9 Units into the skin 3 (three) times daily with meals. CBG 70 - 120: 0 units CBG 121 - 150: 1 unit,  CBG 151 - 200: 2 units,  CBG 201 - 250: 3 units,  CBG 251 - 300: 5 units,  CBG 301 - 350: 7 units,  CBG 351 - 400: 9 units   CBG > 400: 9 units and notify your MD     insulin aspart 100 UNIT/ML injection  Commonly known as:  novoLOG  Inject 3 Units into the skin 3 (three) times daily with meals. Only  if he eats more than 50% of meal     insulin glargine 100 UNIT/ML injection  Commonly known as:  LANTUS  Inject 0.1 mLs (10 Units total) into the skin at bedtime.     ISOPTO TEARS 0.5 % Soln  Generic drug:  Hypromellose  Place 1 drop into both eyes daily.     levothyroxine 100 MCG tablet  Commonly known as:  SYNTHROID, LEVOTHROID  Take 100 mcg by mouth daily before breakfast.     lidocaine 5 %  Commonly known as:  LIDODERM  Place 1 patch onto the skin daily. Applies to right hip daily.  Remove & Discard patch within 12 hours or as directed by MD     metoprolol tartrate 25 MG tablet  Commonly known as:  LOPRESSOR  Take 12.5 mg by mouth 2 (two) times daily.     nitroGLYCERIN 0.4 MG SL tablet  Commonly known as:  NITROSTAT  Place 0.4 mg under the tongue every 5 (five) minutes as needed for chest pain.     nystatin 100000 UNIT/ML suspension  Commonly known as:  MYCOSTATIN  Take 500,000 Units by mouth 4 (four) times daily. Takes for 7 days.  First dose given 09/03/2012.     senna-docusate 8.6-50 MG per tablet  Commonly known as:  Senokot-S  Take 1 tablet by mouth 2 (two) times  daily.     simvastatin 40 MG tablet  Commonly known as:  ZOCOR  Take 40 mg by mouth at bedtime.     tamsulosin 0.4 MG Caps  Commonly known as:  FLOMAX  Take 0.4 mg by mouth daily.     TIMOLOL MALEATE OP  Place 1 drop into the left eye at bedtime.         Brief H and P: For complete details please refer to admission H and P, but in brief, This is an 79 year old male nursing facility resident, [address] of [place] in [place], where he is undergoing rehab, due to a recent hospitalization, with known history of DM, hypothyroidism, s/p multiple CVAs/TIAs, According to spouse, patient was on Eliquis anticoagulation to about a weak ago, but this was discontinued by his provider, due to recurrent falls, and he has been on ASA for approximately one week. Osteoporosis, C3 cervical fracture following a fall, , for which patient wears a soft collar chronically, right great toe gangrene for the past 2 years. He has about a week ago developed a large "blood blister on the medial Aspect, dorsum and volar aspect of left foot. Patient awakened at 0720 on the morning of admission and was at his baseline. At about 08:15 AM, spouse noticed that patient did not "seem right". He had slurred speech, although he appeared to understand commands. Blood glucose was checked, and was not low. He was observed till about 12:00 PM, then EMS was called, as symptoms failed to improve. In the ED, NIHSS scale was 9. Code stroke cancelled at 14:10 PM, as outside tPA time frame.   Hospital Course:   Benjamin Dominguez is an 79 y.o. male who resides at [facility] in [place]. Patient was last seen normal at 0720 by staff on 09/08/2012. At 0815 it was noted that patient had difficulty speaking and what they thought was right sided weakness. Patient has some right arm weakness from previous C3 fracture which he currently is wearing C-collar. Initial CT head showed atrophy without acute infarct. Patient was not a TPA candidate  secondary to delay in arrival. He  was admitted for further evaluation and treatment.  Aphasia/ right-sided weakness: Fluctuating symptoms, TIA versus hypoglycemia/hyperglycemia versus dementia. Patient has signs of dementia on exam, frontal release signs, confusion, and MRI shows moderate diffuse and severe mesial temporal atrophy.  - MRI of the brain showed no acute infarct, abnormal appearance C2 vertebrae (the patient has a reported history of chronic C2 fracture, currently in C-collar)  - 2-D echo showed an EF of 35%, grade 2 diastolic dysfunction, hypokinesis of the basal to mid ventricular segments, cannot rule out prior mitral valve repair moderate, cannot rule out severe MR, no prior echo or stress test available.  - Passed swallow testing, tolerating dysphagia 3 diet with nectar thick consistency - Patient was followed closely by neurology and recommended aspirin 81 mg daily with Plavix especially with his history of coronary disease/MI 2 weeks ago. He's not candidate of oral anticoagulation due to falls.  - Carotid Dopplers not done as patient has chronic C2 fracture, not a candidate for spine surgery per his wife. - Patient also has significant atrophy on MRI and possible underlying dementia. Outpatient EEG can be done to rule out seizures.  Afib with RVR with Cardiomyopathy, MI 2 weeks ago: EF 35%, follows Dr Waldron Labs, was on eliquis which was dc'ed a week ago by Dr Waldron Labs because of falls (falls every 3 weeks per wife). He had an MI 2 weeks and was admitted at [facility]. He was then dc'ed to rehab at [facility]. Patient was sent to [facility] for "stroke like symptoms" and had multiple TIA's in the past. Placed on aspirin and Plavix, currently normal sinus rhythm. Patient was followed closely by cardiology and was placed on oral metoprolol   Hypothyroidism -Continue Synthroid   Diabetes mellitus: Uncontrolled with hypo-and hyperglycemia episodes which also worsened his  neurological symptoms. Diabetic consult was obtained who recommended Lantus 10 units daily, low dose meal coverage if patient eats more than 50% and sensitive sliding scale insulin.  Chronic C2 Cervical spine fracture  - Continue c-collar   Gangrene of foot- chronic RIGHT and ulcer on left  - Foot x-rays were obtained, patient has OM on the right. Discussed in detail with patient's wife, who is aware of osteomyelitis and has been taking care of this ulcer for the last year and states that patient follows in wound care every month (seen by Dr. Lucienne Minks), iodosorb gel dressings every other day. She states amputation was considered but due to his cardiac condition, was not recommended. Patient was seen by wound care consult as well. Continue foam dressing on the left side ulcer. Doxycycline was added to aid in healing.      Day of Discharge BP 132/47  Pulse 87  Temp(Src) 98.6 F (37 C) (Oral)  Resp 18  Ht 5\' 8"  (1.727 m)  Wt 64.609 kg (142 lb 7 oz)  BMI 21.66 kg/m2  SpO2 98%  Physical Exam:  General: Alert and awake, wife at the bedside,  CVS: S1-S2 clear, no murmur rubs or gallops  Chest: clear to auscultation bilaterally, no wheezing, rales or rhonchi  Abdomen: soft nontender, nondistended, normal bowel sounds, no organomegaly  Extremities: Left great toe dressing  Right foot dressing intact   The results of significant diagnostics from this hospitalization (including imaging, microbiology, ancillary and laboratory) are listed below for reference.    LAB RESULTS: Basic Metabolic Panel:  Recent Labs Lab 09/08/12 1338 09/08/12 1356 09/08/12 1722 09/09/12 0530 09/10/12 1220  NA 127* 130*  --  132*  --   K 4.1 4.2  --  3.7  --   CL 92* 93*  --  98  --   CO2 30  --   --  28  --   GLUCOSE 304* 303*  --  91 416*  BUN 24* 25*  --  18  --   CREATININE 1.07 1.20 1.01 0.89  --   CALCIUM 8.0*  --   --  8.1*  --    Liver Function Tests:  Recent Labs Lab 09/08/12 1338  09/09/12 0530  AST 27 23  ALT 18 16  ALKPHOS 77 75  BILITOT 0.1* 0.2*  PROT 5.5* 5.5*  ALBUMIN 2.1* 2.1*   No results found for this basename: LIPASE, AMYLASE,  in the last 168 hours No results found for this basename: AMMONIA,  in the last 168 hours CBC:  Recent Labs Lab 09/08/12 1338  09/08/12 1722 09/09/12 0530  WBC 3.8*  --  3.6* 4.6  NEUTROABS 2.4  --   --   --   HGB 9.7*  < > 9.8* 9.6*  HCT 28.6*  < > 28.2* 28.3*  MCV 81.7  --  80.3 80.2  PLT 250  --  282 304  < > = values in this interval not displayed. Cardiac Enzymes:  Recent Labs Lab 09/09/12 0530 09/09/12 1538  TROPONINI <0.30 <0.30   BNP: No components found with this basename: POCBNP,  CBG:  Recent Labs Lab 09/12/12 0631 09/12/12 1116  GLUCAP 87 363*    Significant Diagnostic Studies:  Dg Chest 2 View  09/08/2012  *RADIOLOGY REPORT*  Clinical Data: Stroke, diabetic  CHEST - 2 VIEW  Comparison: None.  Findings: There are multilobar patchy airspace opacities, most prominent at the lung bases, with small pleural effusions.  Kerley B lines are present.  Mild cardiomegaly noted.  Central vascular congestion is present.  Evidence of prior CABG.  Bilateral, right greater than left, glenohumeral degenerative change.  IMPRESSION: Constellation of findings suggests pulmonary edema.  Follow-up after diuresis may be helpful to exclude underlying pulmonary opacity.   Original Report Authenticated By: Christiana Pellant, M.D.    Ct Head Wo Contrast  09/08/2012  *RADIOLOGY REPORT*  Clinical Data: Right-sided weakness.  CT HEAD WITHOUT CONTRAST  Technique:  Contiguous axial images were obtained from the base of the skull through the vertex without contrast.  Comparison: None.  Findings: Bone windows demonstrate clear paranasal sinuses and mastoid air cells.  Soft tissue windows demonstrate expected cerebral atrophy. Moderate low density in the periventricular white matter likely related to small vessel  disease.Ventriculomegaly, felt to be secondary to cerebral atrophy.  This is mild. No  mass lesion, hemorrhage, hydrocephalus, acute infarct, intra-axial, or extra- axial fluid collection.  IMPRESSION: Cerebral atrophy and small vessel ischemic change, without acute intracranial abnormality. Report called to Dr. Thad Ranger at 1:52 p.m.   Original Report Authenticated By: Jeronimo Greaves, M.D.    Mr Brain Wo Contrast  09/09/2012  *RADIOLOGY REPORT*  Clinical Data:  79 year old diabetic patient presenting with episode of slurred speech.  History of cervical fracture post fall.  MRI BRAIN WITHOUT CONTRAST MRA HEAD WITHOUT CONTRAST  Technique: Multiplanar, multiecho pulse sequences of the brain and surrounding structures were obtained according to standard protocol without intravenous contrast.  Angiographic images of the head were obtained using MRA technique without contrast.  Comparison: 09/08/2012 head CT.  No other exams available for review.  MRI HEAD  Findings:  Mild motion degradation.  No acute infarct.  No intracranial hemorrhage.  Moderate small vessel disease type changes.  Global atrophy without hydrocephalus.  Major intracranial vascular structures are patent.  Bilateral mastoid air cells/middle ear opacification.  Minimal paranasal sinus mucosal thickening.  Abnormal appearance of the C2 vertebra.  This may be related to patient's report history of fracture with the surrounding soft tissue prominence representing reaction to the fracture and transverse ligament hypertrophy.  As the patient is diabetic and has gangrene of the toe, infection not excluded in the proper clinical setting.  This is incompletely assessed on present exam and is causing spinal stenosis and cord flattening.  IMPRESSION: No acute infarct.  Moderate small vessel disease type changes.  Global atrophy without hydrocephalus.  Bilateral mastoid air cells/middle ear opacification.  Minimal paranasal sinus mucosal thickening.  Abnormal  appearance of the C2 vertebra.  This may be related to patient's report history of fracture with the surrounding soft tissue prominence representing reaction to the fracture and transverse ligament hypertrophy.  As the patient is diabetic and has gangrene of the toe, infection not excluded in the proper clinical setting.  This is incompletely assessed on present exam and is causing spinal stenosis and cord flattening.  MRA HEAD  Findings: Motion degraded exam.  This limits grading stenosis accurately and for evaluating for possibility of detecting small aneurysm.  Ectatic distal vertical cervical segment of the internal carotid arteries greater on the right.  Mild narrowing and irregularity cavernous segment of the internal carotid artery bilaterally with small bulges on the left probably related to atherosclerotic type changes rather than discrete aneurysm.  Moderate narrowing left-sided and mild narrowing right sided supraclinoid aspect of the internal carotid artery.  Moderate narrowing left-sided and mild irregularity of right-sided A1 segment of the anterior cerebral artery.  Mild narrowing left M1 segment left middle cerebral artery.  Moderate narrowing middle cerebral artery branch vessels bilaterally with decreased number visualized right middle cerebral artery branches.  Left vertebral artery is dominant.  Mild narrowing distal right vertebral artery.  Mild to moderate narrowing proximal basilar artery.  Nonvisualization AICAs.  Moderate tandem stenoses superior cerebellar artery bilaterally and the proximal aspect posterior cerebral artery bilaterally with distal posterior cerebral artery moderate branch vessel irregularity bilaterally.  IMPRESSION: Intracranial atherosclerotic type changes as detailed above.  Exam is motion degraded.   Original Report Authenticated By: Lacy Duverney, M.D.    Dg Foot 2 Views Left  09/09/2012  *RADIOLOGY REPORT*  Clinical Data: 79 year old male with gangrene of the toes.   Left foot wound.  LEFT FOOT - 2 VIEW  Comparison: None.  Findings: Diffuse severe calcified atherosclerosis in the foot. Diffuse osteopenia.  No subcutaneous gas identified.  Round lucency in the calcaneus may reflect previous external fixation device/surgical change. Otherwise, benign etiology such as intraosseous lipoma suspected.  Advanced degenerative changes suspected at the ankle. No focal osteolysis identified in the left foot to suggest acute osteomyelitis.  IMPRESSION: No radiographic evidence of acute osteomyelitis in the left foot. Osteopenia, advanced calcified atherosclerosis, and degenerative changes at the left ankle.   Original Report Authenticated By: Erskine Speed, M.D.    Dg Foot 2 Views Right  09/09/2012  *RADIOLOGY REPORT*  Clinical Data: 79 year old male with gangrene of the toes.  Foot wound.  RIGHT FOOT - 2 VIEW  Comparison: None.  Findings: Soft tissue deficiency of the right great toe appears represent previous partial amputation.  The tuft of the distal phalanx is absent, with fairly circumscribed margin of the distal residual distal phalanx.  There is cortical osteolysis at the medial first IP joint, but this more resembles degenerative change. Similar pencil-in-cup deformity at the first MTP joint.  Underlying osteopenia and severe calcified atherosclerosis.  No subcutaneous gas identified.  Calcaneus appears intact.  IMPRESSION: 1.  Evidence of previous partial right great toe amputation. 2.  Medial first IP periarticular osteolysis is present, but favor degenerative rather than indicative of acute osteomyelitis. Short interval radiographic follow up (7-14 days) may be helpful to exclude acute osteomyelitis if clinically necessary. 3.  Similar probable degenerative osseous remodelling at the first MTP joint ("pencil-in-cup deformity").   Original Report Authenticated By: Erskine Speed, M.D.    Mr Mra Head/brain Wo Cm  09/09/2012  *RADIOLOGY REPORT*  Clinical Data:  79 year old  diabetic patient presenting with episode of slurred speech.  History of cervical fracture post fall.  MRI BRAIN WITHOUT CONTRAST MRA HEAD WITHOUT CONTRAST  Technique: Multiplanar, multiecho pulse sequences of the brain and surrounding structures were obtained according to standard protocol without intravenous contrast.  Angiographic images of the head were obtained using MRA technique without contrast.  Comparison: 09/08/2012 head CT.  No other exams available for review.  MRI HEAD  Findings:  Mild motion degradation.  No acute infarct.  No intracranial hemorrhage.  Moderate small vessel disease type changes.  Global atrophy without hydrocephalus.  Major intracranial vascular structures are patent.  Bilateral mastoid air cells/middle ear opacification.  Minimal paranasal sinus mucosal thickening.  Abnormal appearance of the C2 vertebra.  This may be related to patient's report history of fracture with the surrounding soft tissue prominence representing reaction to the fracture and transverse ligament hypertrophy.  As the patient is diabetic and has gangrene of the toe, infection not excluded in the proper clinical setting.  This is incompletely assessed on present exam and is causing spinal stenosis and cord flattening.  IMPRESSION: No acute infarct.  Moderate small vessel disease type changes.  Global atrophy without hydrocephalus.  Bilateral mastoid air cells/middle ear opacification.  Minimal paranasal sinus mucosal thickening.  Abnormal appearance of the C2 vertebra.  This may be related to patient's report history of fracture with the surrounding soft tissue prominence representing reaction to the fracture and transverse ligament hypertrophy.  As the patient is diabetic and has gangrene of the toe, infection not excluded in the proper clinical setting.  This is incompletely assessed on present exam and is causing spinal stenosis and cord flattening.  MRA HEAD  Findings: Motion degraded exam.  This limits  grading stenosis accurately and for evaluating for possibility of detecting small aneurysm.  Ectatic distal vertical cervical segment of the internal carotid arteries greater on the right.  Mild narrowing and irregularity cavernous segment of the internal carotid artery bilaterally with small bulges on the left probably related to atherosclerotic type changes rather than discrete aneurysm.  Moderate narrowing left-sided and mild narrowing right sided supraclinoid aspect of the internal carotid artery.  Moderate narrowing left-sided and mild irregularity of right-sided A1 segment of the anterior cerebral artery.  Mild narrowing left M1 segment left middle cerebral artery.  Moderate narrowing middle cerebral artery branch vessels bilaterally with decreased number visualized right middle cerebral artery branches.  Left vertebral artery is dominant.  Mild narrowing distal right vertebral artery.  Mild to moderate narrowing proximal basilar artery.  Nonvisualization AICAs.  Moderate tandem stenoses superior cerebellar artery bilaterally and the proximal aspect posterior cerebral artery bilaterally with distal posterior cerebral artery moderate branch vessel irregularity bilaterally.  IMPRESSION: Intracranial atherosclerotic type  changes as detailed above.  Exam is motion degraded.   Original Report Authenticated By: Lacy Duverney, M.D.       Disposition and Follow-up: Discharge Orders   Future Orders Complete By Expires           Increase activity slowly  As directed         DISPOSITION:  skilled nursing facility  DIET: Dysphagia 3 diet, nectar thick ACTIVITY: As tolerated   DISCHARGE FOLLOW-UP Follow-up Information   Follow up with Bosie Clos, MD. Schedule an appointment as soon as possible for a visit in 10 days.   Contact information:   [address] [place] [address] [phone]       Follow up with Gates Rigg, MD. Schedule an appointment as soon as possible for a  visit in 2 months. (for stroke follow-up, will need out-patient EEG)    Contact information:   [address] [place] [address] [phone]       Time spent on Discharge: 45 mins  Signed:   RAI,RIPUDEEP M.D. [facility] 09/12/2012, 1:58 PM Pager: [phone]

## 2012-09-12 NOTE — Evaluation (Signed)
Speech Language Pathology Evaluation Patient Details Name: Benjamin Dominguez MRN: [id number] DOB: 1923-02-15 Today's Date: 09/12/2012 Time: 1420-1440 SLP Time Calculation (min): 20 min  Problem List:  Patient Active Problem List  Diagnosis  . Aphasia  . Right sided weakness  . Hypothyroidism  . Diabetes mellitus  . Cervical spine fracture  . Gangrene of foot  . CVA (cerebral infarction)  . Hypoglycemia  . Atrial fibrillation  . CAD (coronary artery disease)    Past Medical History:  Past Medical History  Diagnosis Date  . Diabetes mellitus without complication   . Stroke     Multiple TIAs/CVAs  . Hypothyroidism   . 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 C3 cervical fracture 07/2011    unhealed d/t osteoporosis  . CAD (coronary artery disease)     CABG 1988, last cath 2003 ~ distal disease per wife. Does not want any more caths, MI 07/2012 managed medically at [facility] per wife.  . A-fib   . MI (myocardial infarction) March 2014    see at [facility]   Past Surgical History:  Past Surgical History  Procedure Laterality Date  . Cardiac surgery    . Leg surgery Left    HPI:  Benjamin Dominguez is an 79 y.o. male who resides at Nursing home [address] [facility] [place] in [place]. Patient was last seen normal at 0720 by staff on 09/08/2012. At 0815 it w0as noted that patient had difficulty speaking and what they thought was right sided weakness. Of note: patient has some right arm weakness from previous C3 fracture which he currently is wearing C-collar. Wife was present at nursing home, and stated he usually has expressive difficulties and right sided weakness with previous TIA's but usually clears quickly. His symptoms today did not resolve as quickly as usual. Currently patient is still showing expressive aphasia, and right arm weakness. Initial CT head showed atrophy without acute infarct. Patient was not a TPA candidate secondary to delay in arrival. He was  admitted for further evaluation and treatment.   Assessment / Plan / Recommendation Clinical Impression  Patient presents with no overt cognitive-linguistic deficits observed by SLP during today's evaluation, however pt's wife reports baseline memory deficits with a steady decline in memory function over the past 2 years. Pt demonstrates good reasoning skills with no overt impairments in cogntiive function and/or langauge. Pt very hard of hearing and required several repetitions with increased volume and increased word stress, which were effective. Bottom dentures contiuously came out of place during evaluation which reduced speech intelligibility, otherwise pt with intelligibile speech. Pt and wife report he is back at prior level of functioning and currently utilizing compensatory strategies for memory deficits, no f/u needed for cognitive/linguistic tx while in acute setting.    SLP Assessment  Patient does not need any further Speech Lanaguage Pathology Services    Follow Up Recommendations  Skilled Nursing facility    Frequency and Duration        Pertinent Vitals/Pain None reported   SLP Goals     SLP Evaluation Prior Functioning  Cognitive/Linguistic Baseline: Baseline deficits Baseline deficit details: baseline memory deficits Type of Home: Skilled Nursing Facility Lives With: Spouse Available Help at Discharge: Family;Available 24 hours/day Vocation: Retired   IT consultant  Overall Cognitive Status: Impaired at baseline Arousal/Alertness: Awake/alert Orientation Level: Oriented to person;Oriented to time;Disoriented to place;Disoriented to situation Memory: Appears intact Memory Impairment: Decreased short term memory;Decreased recall of new information (None observed by SLP,  reported per pt's wife) Decreased Short Term Memory: Functional basic Problem Solving: Appears intact Safety/Judgment: Appears intact    Comprehension  Auditory Comprehension Overall Auditory  Comprehension: Appears within functional limits for tasks assessed Yes/No Questions: Within Functional Limits Commands: Within Functional Limits Interfering Components: Hearing EffectiveTechniques: Repetition;Increased volume;Stressing words Visual Recognition/Discrimination Discrimination: Within Function Limits Reading Comprehension Reading Status: Within funtional limits    Expression Expression Primary Mode of Expression: Verbal Verbal Expression Overall Verbal Expression: Appears within functional limits for tasks assessed Initiation: No impairment Automatic Speech: Month of year;Day of week Repetition: No impairment Naming: No impairment Pragmatics: No impairment Written Expression Dominant Hand: Right Written Expression: Within Functional Limits   Oral / Motor Oral Motor/Sensory Function Overall Oral Motor/Sensory Function: Impaired Labial ROM: Reduced right Labial Symmetry: Abnormal symmetry right Labial Strength: Reduced Labial Sensation: Within Functional Limits Lingual ROM: Within Functional Limits Lingual Symmetry: Within Functional Limits Lingual Strength: Within Functional Limits Lingual Sensation: Within Functional Limits Facial Sensation: Within Functional Limits Motor Speech Overall Motor Speech: Other (comment) (Pt's dentures w/out adequate fit, reduced intelligibility) Phonation: Normal Resonance: Within functional limits Articulation: Within functional limitis Intelligibility: Intelligibility reduced (Reduced due to improper denture fit) Word: 75-100% accurate Phrase: 75-100% accurate Sentence: 75-100% accurate Conversation: 75-100% accurate   GO   Berdine Dance SLP student  Berdine Dance 09/12/2012, 3:59 PM

## 2012-09-12 NOTE — Progress Notes (Signed)
Inpatient Diabetes Program Recommendations  AACE/ADA: New Consensus Statement on Inpatient Glycemic Control (2013)  Target Ranges:  Prepandial:   less than 140 mg/dL      Peak postprandial:   less than 180 mg/dL (1-2 hours)      Critically ill patients:  140 - 180 mg/dL   Reason for Assessment: Consult  Inpatient Diabetes Program Recommendations Insulin - Basal: Recommend Lantus 10 units at bedtime.  Dosage will need to be titrated at the SNF. Correction (SSI): Recommend change to sensitive correction scale tid. Insulin - Meal Coverage: Recommend meal coverage initially at 3 units Novolog tid with meals (provided patient eats at least 50% of meal)  Dosage will need to be titrated at the SNF. HgbA1C: Hgb A1C was 8.2-- given advanced age and multiple medical problems, this is adequate.     Note:  Per Med Rec information, patient received NPH 20 units daily every morning at the SNF.  Low dosages of insulin divided in multiple doses may help with swings in blood sugars and help prevent some hypoglycemia.  Thank you.  Hasheem Voland S. Elsie Lincoln, RN, CNS, CDE Inpatient Diabetes Program, team pager [phone]

## 2012-09-12 NOTE — Clinical Social Work Psychosocial (Signed)
Clinical Social Work Department BRIEF PSYCHOSOCIAL ASSESSMENT 09/12/2012  Patient:  Benjamin Dominguez, Benjamin Dominguez     Account Number:  [id number]     Admit date:  09/08/2012  Clinical Social Worker:  Peggyann Shoals  Date/Time:  09/12/2012 05:22 PM  Referred by:  Physician  Date Referred:  09/12/2012 Referred for  Other - See comment   Other Referral:   Pt was admitted from a facility   Interview type:  Family Other interview type:    PSYCHOSOCIAL DATA Living Status:  FACILITY Admitted from facility:  [facility] Level of care:  Skilled Nursing Facility Primary support name:  Lefebre,Colleen Primary support relationship to patient:  SPOUSE Degree of support available:   very supportive.    CURRENT CONCERNS Current Concerns  Post-Acute Placement   Other Concerns:    SOCIAL WORK ASSESSMENT / PLAN CSW met with pt and wife to address consult. CSW introduced herself and explained role of social work. CSW also explained the process of returning to SNF. Pt is a short term rehab patient of [facility]. CSW contacted facility and facility is able to accept him at dishcarge.    Pt is ready for discharge. Facility has recieved discharge summary and is ready to admit pt. Pt and family are agreeable to discharge plan. [facility] will provide transportation. CSW is signing off as no further needs identified.   Assessment/plan status:  No Further Intervention Required Other assessment/ plan:   Information/referral to community resources:   Pt is a patient of [facility].    PATIENT'S/FAMILY'S RESPONSE TO PLAN OF CARE: Pt and wife are pleasant and agreeable to discharge plan.    Dede Query, MSW, LCSW [phone]

## 2012-09-13 LAB — GLUCOSE, CAPILLARY

## 2012-09-13 NOTE — Care Management Note (Signed)
    Page 1 of 1   09/13/2012     11:26:47 AM   CARE MANAGEMENT NOTE 09/13/2012  Patient:  Benjamin Dominguez, Benjamin Dominguez   Account Number:  [id number]  Date Initiated:  09/13/2012  Documentation initiated by:  Jacquelynn Cree  Subjective/Objective Assessment:   admitted with aphasia, rt sided weakness     Action/Plan:   from SNF, plan to return   Anticipated DC Date:     Anticipated DC Plan:    In-house referral  Clinical Social Worker      DC Planning Services  CM consult      Choice offered to / List presented to:             Status of service:  Completed, signed off Medicare Important Message given?   (If response is "NO", the following Medicare IM given date fields will be blank) Date Medicare IM given:   Date Additional Medicare IM given:    Discharge Disposition:  SKILLED NURSING FACILITY  Per UR Regulation:  Reviewed for med. necessity/level of care/duration of stay  If discussed at Long Length of Stay Meetings, dates discussed:    Comments:

## 2012-09-28 ENCOUNTER — Encounter: Payer: Self-pay | Admitting: Nurse Practitioner

## 2012-09-29 ENCOUNTER — Encounter: Payer: Self-pay | Admitting: Nurse Practitioner

## 2012-09-29 ENCOUNTER — Ambulatory Visit: Payer: Self-pay | Admitting: Nurse Practitioner

## 2012-09-29 ENCOUNTER — Encounter: Payer: Self-pay | Admitting: Internal Medicine

## 2012-10-26 ENCOUNTER — Encounter: Payer: Self-pay | Admitting: Cardiothoracic Surgery

## 2012-10-30 ENCOUNTER — Ambulatory Visit: Payer: Self-pay | Admitting: Nurse Practitioner

## 2012-10-30 ENCOUNTER — Encounter: Payer: Self-pay | Admitting: Cardiothoracic Surgery

## 2012-10-30 ENCOUNTER — Encounter: Payer: Self-pay | Admitting: Nurse Practitioner

## 2012-12-30 ENCOUNTER — Ambulatory Visit: Payer: Self-pay | Admitting: Internal Medicine

## 2013-01-01 ENCOUNTER — Observation Stay: Payer: Self-pay | Admitting: Internal Medicine

## 2013-01-01 LAB — COMPREHENSIVE METABOLIC PANEL
Alkaline Phosphatase: 72 U/L (ref 50–136)
Glucose: 218 mg/dL — ABNORMAL HIGH (ref 65–99)
Osmolality: 278 (ref 275–301)
Potassium: 4.7 mmol/L (ref 3.5–5.1)
SGOT(AST): 28 U/L (ref 15–37)
SGPT (ALT): 19 U/L (ref 12–78)
Sodium: 134 mmol/L — ABNORMAL LOW (ref 136–145)
Total Protein: 5.9 g/dL — ABNORMAL LOW (ref 6.4–8.2)

## 2013-01-01 LAB — URINALYSIS, COMPLETE
Bacteria: NONE SEEN
Blood: NEGATIVE
Leukocyte Esterase: NEGATIVE
RBC,UR: 1 /HPF (ref 0–5)
Specific Gravity: 1.007 (ref 1.003–1.030)
Squamous Epithelial: NONE SEEN

## 2013-01-01 LAB — CBC
HCT: 35 % — ABNORMAL LOW (ref 40.0–52.0)
HGB: 12.2 g/dL — ABNORMAL LOW (ref 13.0–18.0)
MCH: 29 pg (ref 26.0–34.0)
MCV: 83 fL (ref 80–100)
RBC: 4.21 10*6/uL — ABNORMAL LOW (ref 4.40–5.90)
RDW: 14.6 % — ABNORMAL HIGH (ref 11.5–14.5)
WBC: 5.8 10*3/uL (ref 3.8–10.6)

## 2013-01-01 LAB — TROPONIN I: Troponin-I: 0.18 ng/mL — ABNORMAL HIGH

## 2013-01-26 ENCOUNTER — Ambulatory Visit: Payer: Self-pay | Admitting: Nurse Practitioner

## 2013-01-30 DEATH — deceased

## 2013-04-01 ENCOUNTER — Ambulatory Visit: Payer: Self-pay | Admitting: Internal Medicine

## 2013-05-10 IMAGING — CT CT HEAD WITHOUT CONTRAST
2 series · 15 of 30 positions shown, 19 images · non-contrast
Comparison: none

REASON FOR EXAM: fell with abrasion on forehead and fell again today
Flex 8
COMMENTS:   LMP: (Male)

[Series 2: without · axial · non-contrast · 0.43mm/px · z∈[-170,-40]mm · 13 of 32 slices shown, 17 images]
[im 3/32  brain]
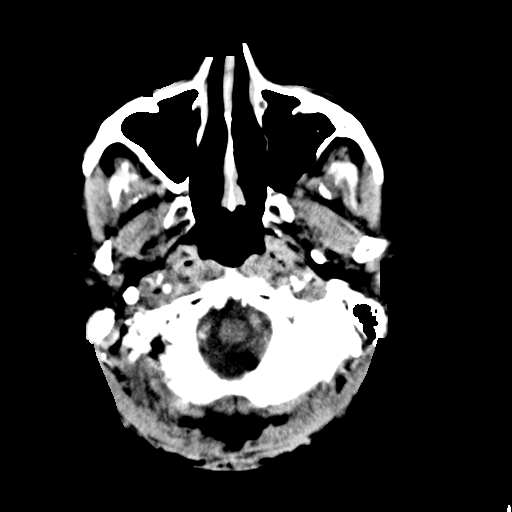
[im 3/32  bone]
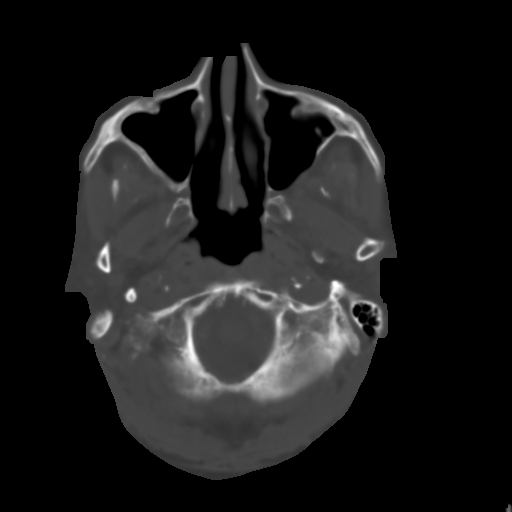
[im 5/32  brain]
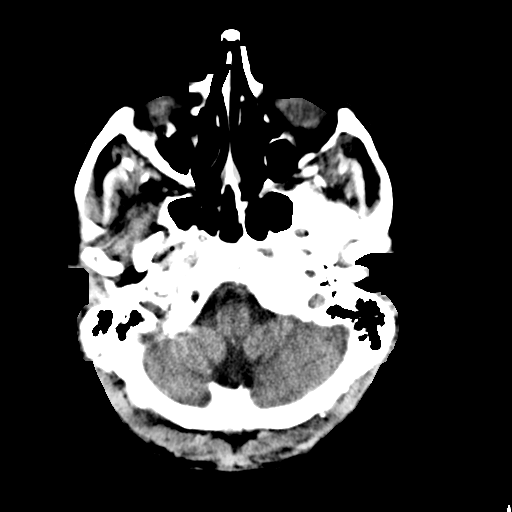
[im 7/32  brain]
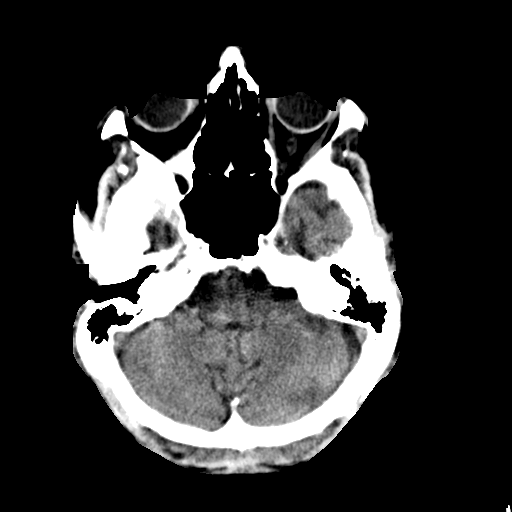
[im 9/32  brain]
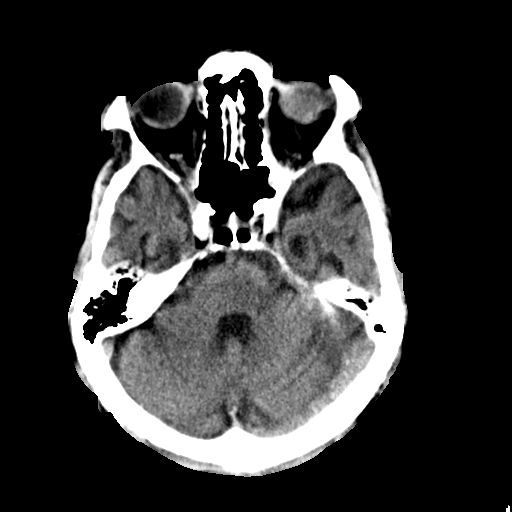
[im 12/32  brain]
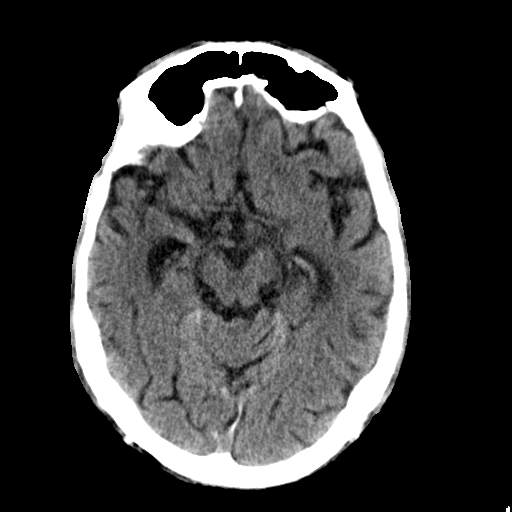
[im 12/32  bone]
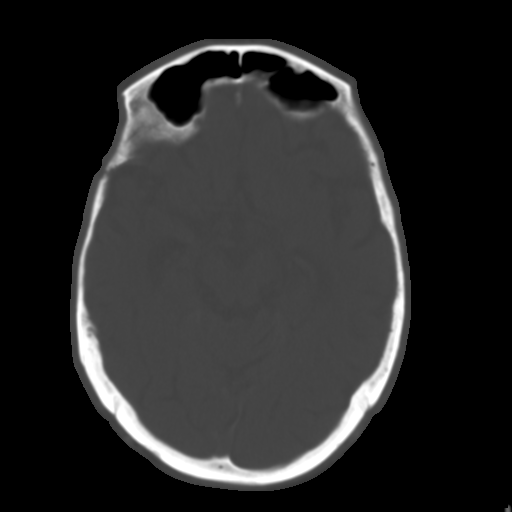
[im 14/32  brain]
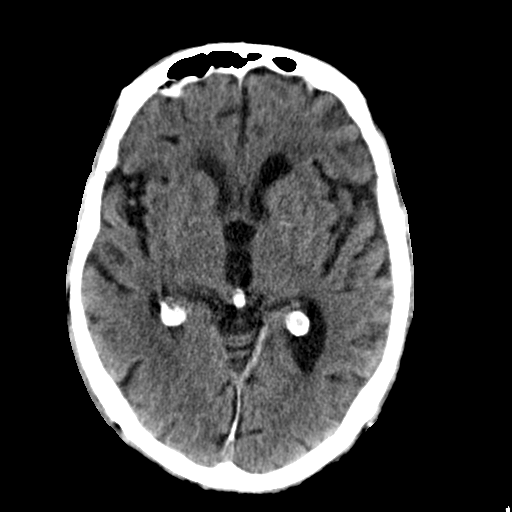
[im 16/32  brain]
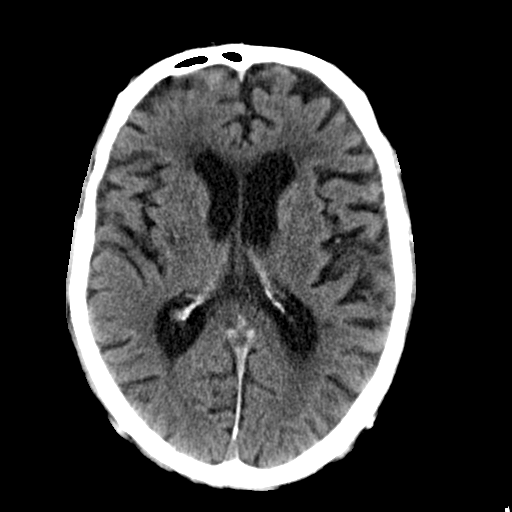
[im 18/32  brain]
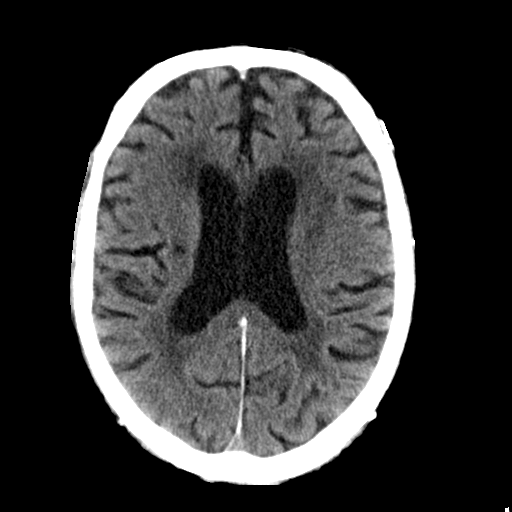
[im 20/32  brain]
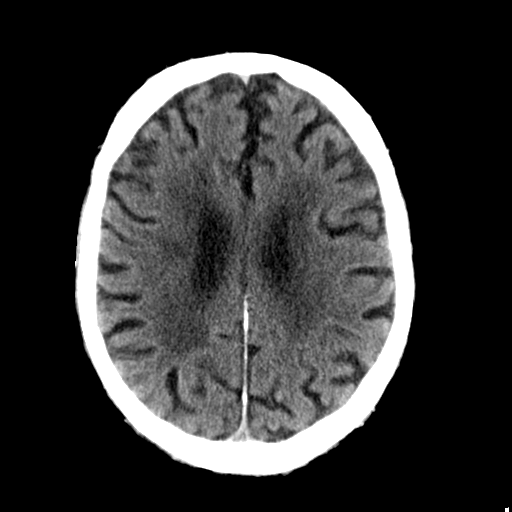
[im 20/32  bone]
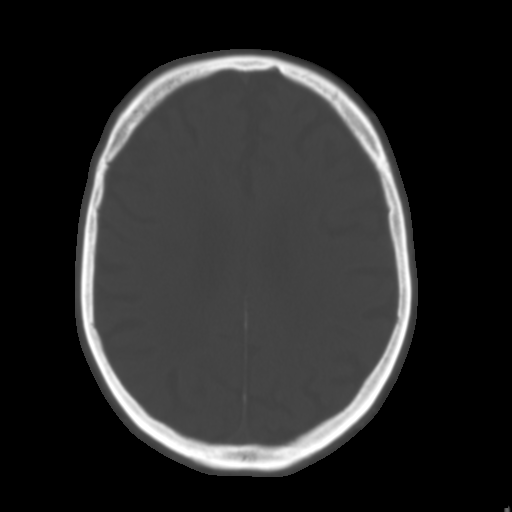
[im 23/32  brain]
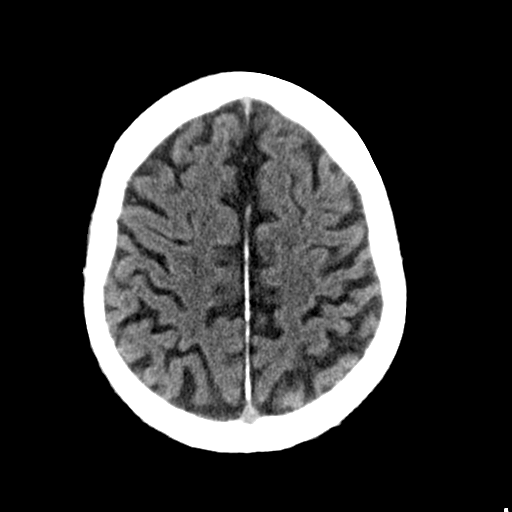
[im 25/32  brain]
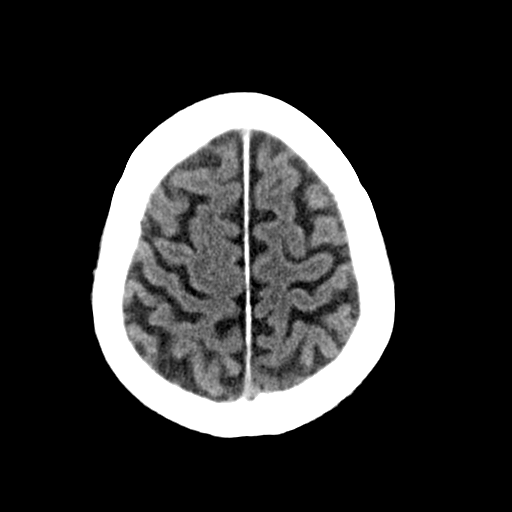
[im 27/32  brain]
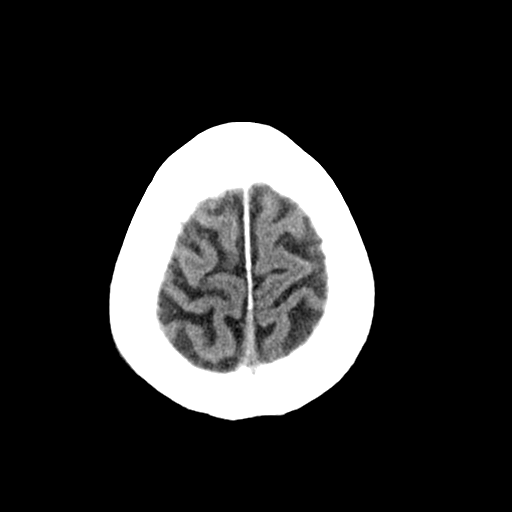
[im 29/32  brain]
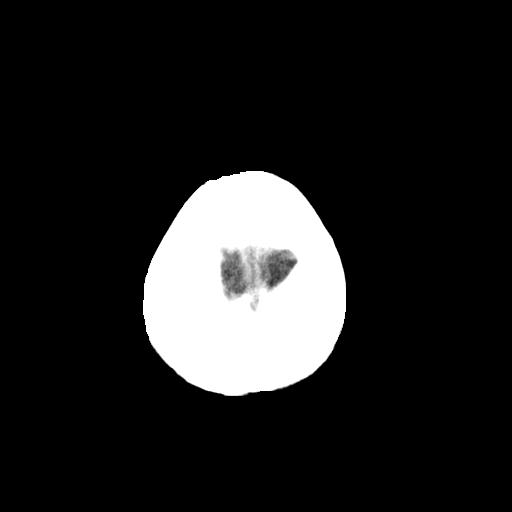
[im 29/32  bone]
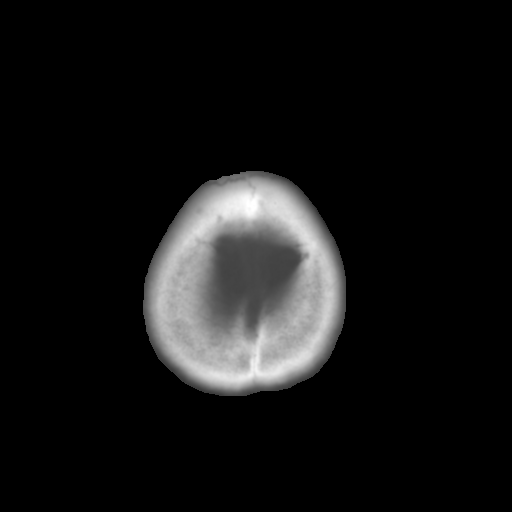

[Series 3: bone · axial · 0.43mm/px · z∈[-170,-150]mm · 2 of 32 slices shown]
[im 3/32  bone]
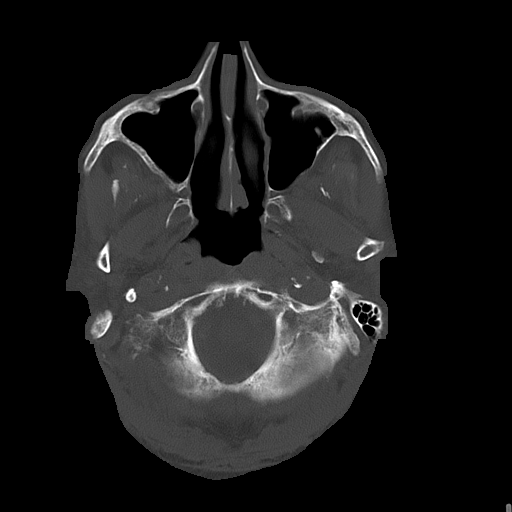
[im 7/32  bone]
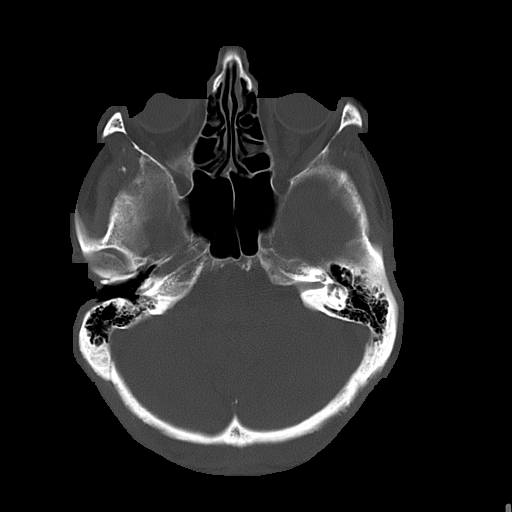

[15 of 30 positions shown; findings below may reference images not displayed]

PROCEDURE:     CT  - CT HEAD WITHOUT CONTRAST  - August 13, 2011  [DATE]

RESULT:     Axial noncontrast CT scanning was performed through the brain
with reconstructions at 5 mm intervals and slice thicknesses. Comparison
made to an MRI of the brain dated 21 May, 2005.

There is mild diffuse cerebral and cerebellar atrophy with compensatory
ventriculomegaly. This has slightly progressed since 0118. There is no
evidence of an acute intracranial hemorrhage nor of an evolving ischemic
infarction. There are basal ganglia calcifications bilaterally. There is no
intracranial mass effect nor hydrocephalus. The cerebellum and brainstem
exhibit no acute abnormality. At bone window settings the observed portions
of the paranasal sinuses and mastoid air cells are clear. There is no
evidence of an acute skull fracture.
IMPRESSION: There are chronic abnormalities within the brain consistent
with small vessel ischemia, but I do not see evidence of an acute ischemic
or hemorrhagic event. There is no evidence of an acute skull fracture.

## 2013-06-03 IMAGING — CT CT CERVICAL SPINE WITHOUT CONTRAST
1 series · 12 of 14 positions shown, 15 images · non-contrast
Comparison: none

REASON FOR EXAM: neck pain
COMMENTS:

PROCEDURE:     CT  - CT CERVICAL SPINE WO  - September 06, 2011 [DATE]
RESULT:     Comparison: None.
TECHNIQUE: Multiple axial CT images were obtained of the cervical spine,
without intravenous contrast.  Sagittal and coronal reformatted images were
constructed.

[Series 6: axial · axial · 0.40mm/px · z∈[+84,+220]mm · 12 of 82 slices shown, 15 images]
[im 7/82  soft-tissue]
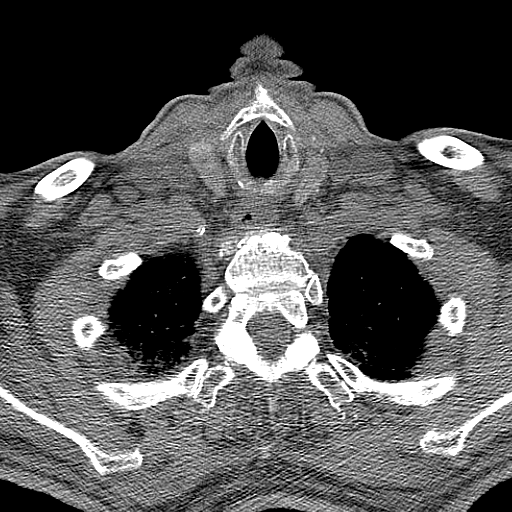
[im 7/82  bone]
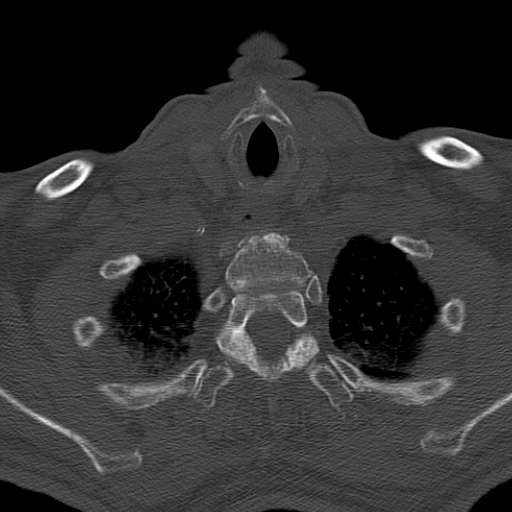
[im 13/82  bone]
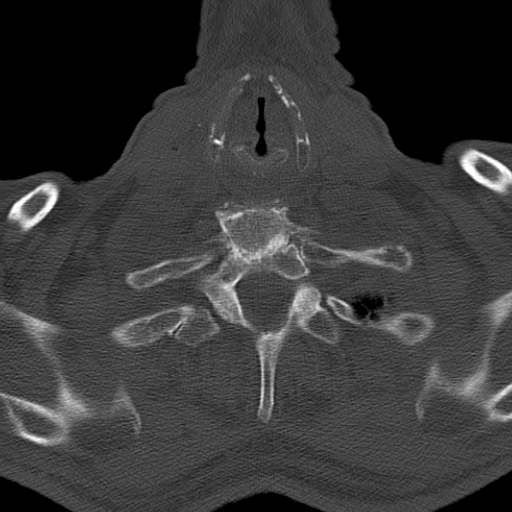
[im 19/82  bone]
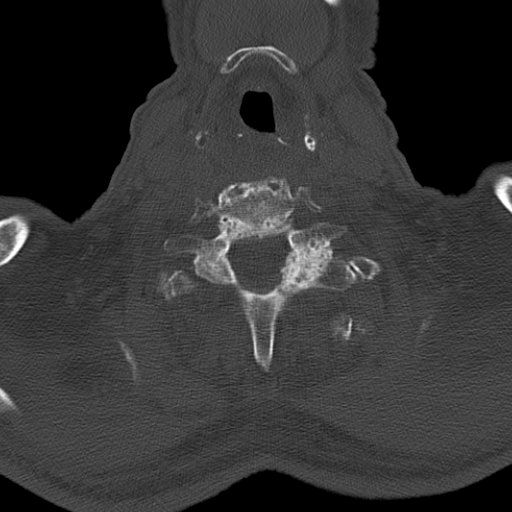
[im 25/82  bone]
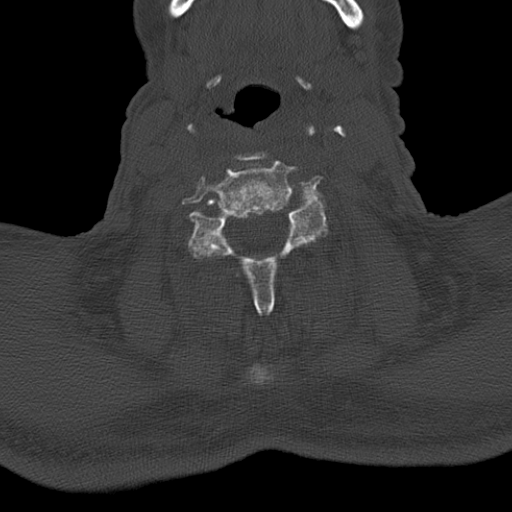
[im 32/82  soft-tissue]
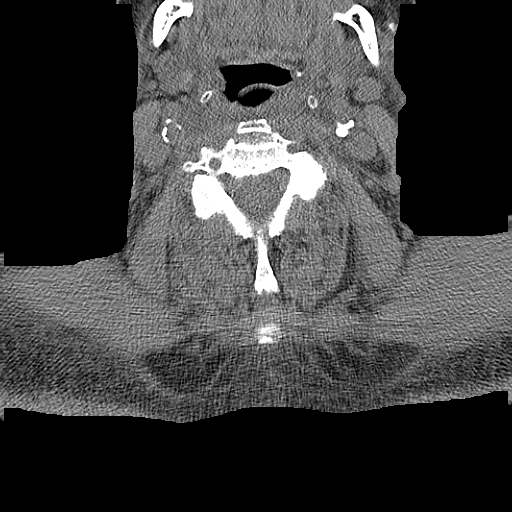
[im 32/82  bone]
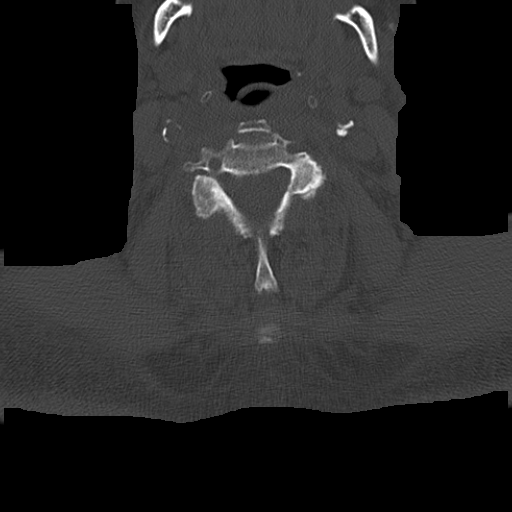
[im 38/82  bone]
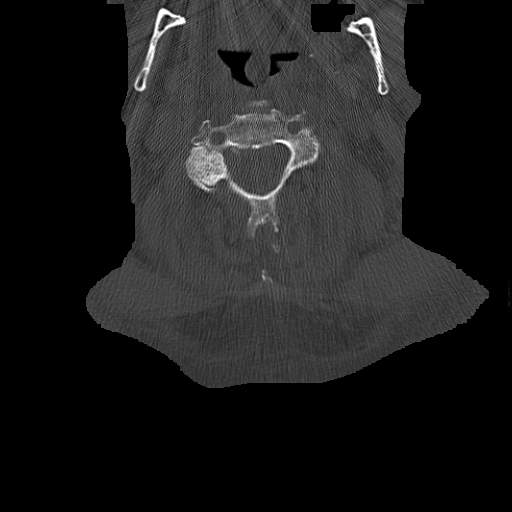
[im 44/82  bone]
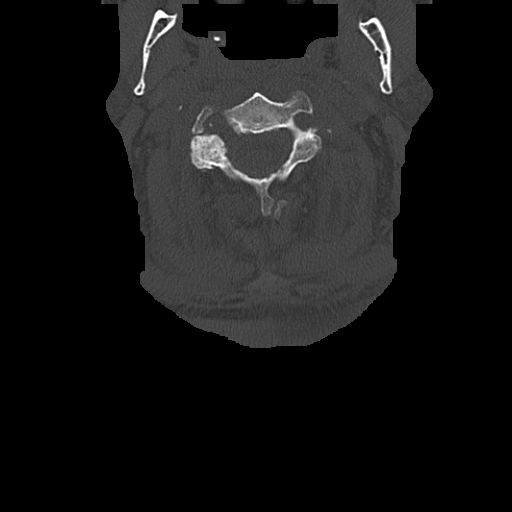
[im 50/82  bone]
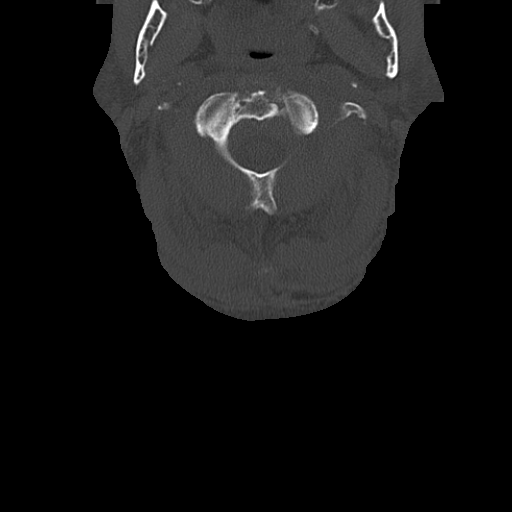
[im 57/82  soft-tissue]
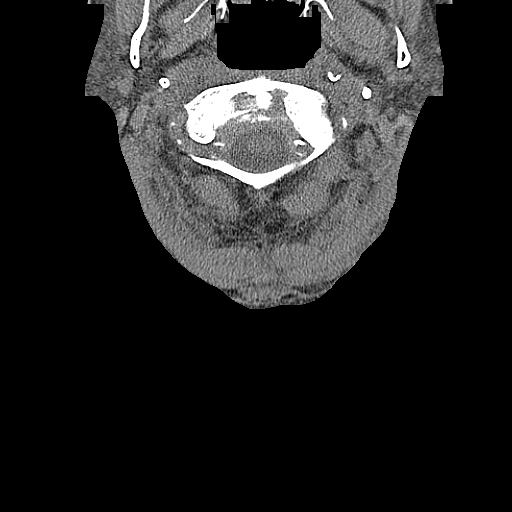
[im 57/82  bone]
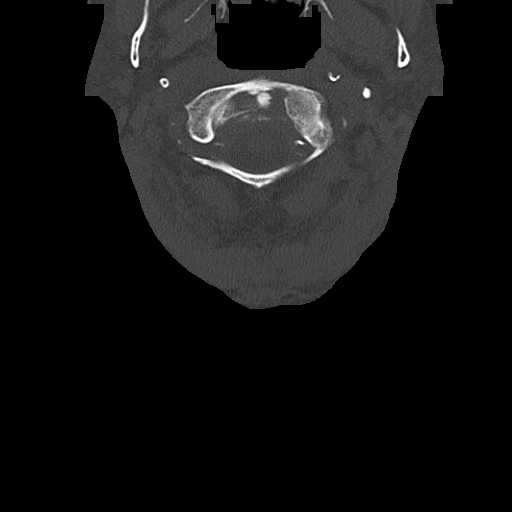
[im 63/82  bone]
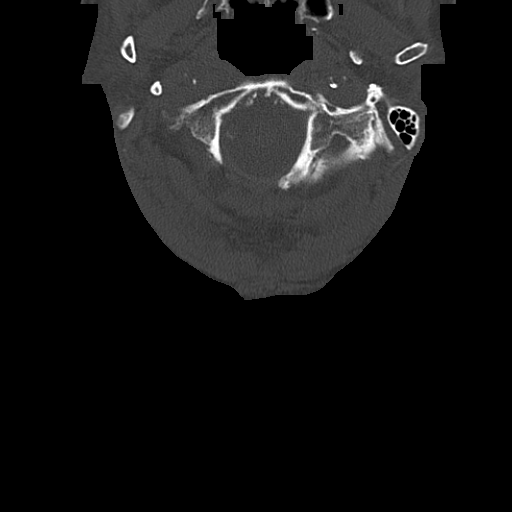
[im 69/82  bone]
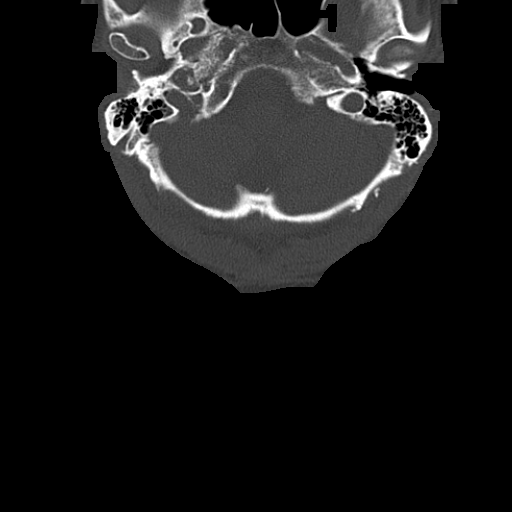
[im 75/82  bone]
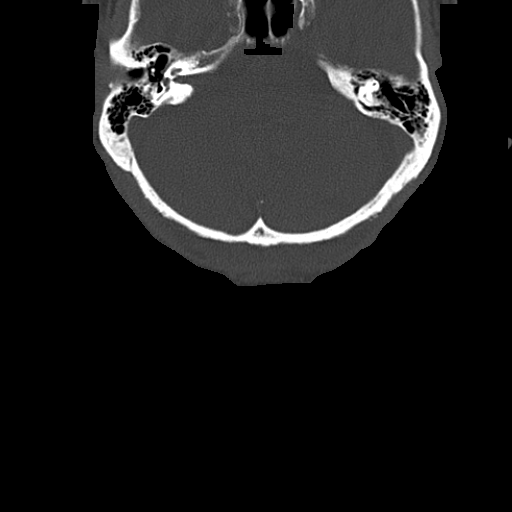

[12 of 14 positions shown; findings below may reference images not displayed]

FINDINGS: There is an obliquely oriented linear lucency traversing the body of the
dens. This courses from the anterior mid body of the dens to the posterior
cortex of the dens just above the level of the body of C2. This is in the
sagittal dimension. This is concerning for fracture. This is age
indeterminate, but concerning for acute fracture. There is some cystic
change of the dens. There mild adjacent partially calcified lytic changes.
There is straightening of the normal cervical lordosis, which is
nonspecific. There is approximately 2 mm of anterolisthesis of C3 on C4 and
C7 on T1. There is multilevel degenerative disc disease.
IMPRESSION: 1. Nondisplaced fracture of the body of the dens. This could be an acute or
subacute fracture.  This was called to Dr. Ervista Ceola at 9809 hours
09/06/2011
2. Mild anterolisthesis of C3 on C[DATE] be degenerative. Ligamentous injury
is not excluded.

## 2014-09-18 NOTE — Consult Note (Signed)
General Aspect patient is an 81 year old male with type 1 diabetes mellitus for approximately 78 years, history of coronary artery disease status post coronary bypass grafting in 1988, hypertension, hyperlipidemia peripheral vascular disease and mild aortic stenosis who was admitted with a prolonged episode of chest pain patient is also been treated for atrial fibrillation with amiodarone.  He had the amiodarone discontinued due to potential side effects.  He was on Plavix for peripheral vascular disease however this was changed to Eliquis recently.  He had mild serum troponin elevation of 0.72.  He was noted to be significantly anemic with a hemoglobin of 8.7 however this is near his baseline.  He currently remains stable.  He was evaluated by respiratory therapy and found to have normal pulse oximetry on room air.  Patient and his wife are anxious for him to go home.  He has no active bleeding.  He denies chest pain at present.  The patient and his wife are not amenable to having him consider any invasive evaluation given his comorbid condition.   Physical Exam:   GEN disheveled    HEENT hearing intact to voice    NECK supple    RESP clear BS  no use of accessory muscles    CARD Irregular rate and rhythm  Murmur    Murmur Systolic    Systolic Murmur Out flow    ABD denies tenderness  normal BS    LYMPH negative neck    EXTR negative cyanosis/clubbing    SKIN normal to palpation    NEURO cranial nerves intact, motor/sensory function intact    PSYCH A+O to time, place, person   Review of Systems:   Subjective/Chief Complaint no chest pain at present.  Has a soft collar in place with some neck pain.    General: Weakness    Skin: No Complaints    ENT: No Complaints    Eyes: No Complaints    Neck: Pain    Respiratory: No Complaints    Cardiovascular: Chest pain or discomfort    Gastrointestinal: No Complaints    Genitourinary: No Complaints    Vascular: No  Complaints    Musculoskeletal: No Complaints    Neurologic: No Complaints    Hematologic: No Complaints    Endocrine: No Complaints    Psychiatric: No Complaints    Review of Systems: All other systems were reviewed and found to be negative    Medications/Allergies Reviewed Medications/Allergies reviewed   Home Medications: Medication Instructions Status  Nitrostat 0.4 mg sublingual tablet 1 tab(s) sublingual every 5 minutes up to 3 doses as needed for chest pain. **if no relief call md or go to emergency room*  Active  levothyroxine 100 mcg (0.1 mg) oral tablet 1 tab(s) orally once a day Active  acetaminophen 650 mg oral tablet, extended release 1 tab(s) orally every 4 hours as needed for pain.  Active  Aspirin Enteric Coated 81 mg oral delayed release tablet 1 tab(s) orally once a day (at bedtime) Active  sulfamethoxazole-trimethoprim 800 mg-160 mg oral tablet 1 tab(s) orally every 12 hours Active  Eliquis 2.5 mg oral tablet 1 tab(s) orally 2 times a day Active  Fluocinonide-E 0.05% topical cream Apply topically to affected area once a day as needed for rash. Active  losartan 50 mg oral tablet 1 tab(s) orally once a day. Active  Lubricant Eye Drops ophthalmic solution 1 drop(s) to each affected eye once a day Active  metoprolol tartrate 25 mg oral tablet 0.5 tab(s)  orally 2 times a day Active  simvastatin 40 mg oral tablet 1 tab(s) orally once a day (at bedtime). Active  Senna S 50 mg-8.6 mg oral tablet 2 tab(s) orally 2 times a day Active  Humulin N 100 units/mL subcutaneous suspension 22 unit(s) subcutaneous once a day (in the morning), 5 units subcutaneous once a day at 6:30pm, and sliding scale at bedtime depending on how high sugar is. *pt prefer Humulin N over Novolin N* Active   EKG:   Abnormal atrial fibrillation    Prinivil: Unknown  Aggrenox: Unknown    Impression 81 year old male with history of coronary disease status post coronary bypass grafting, mild aortic  stenosis, hypertension, hyperlipidemia, neck pain, type 1 diabetes who was admitted with anemia and chest pain.  He has a mild serum troponin elevation of 0.72.  EKG did not reveal any injury current or active ischemia.  He is currently hemodynamically stable.  His hemoglobin was 9.1.  This is near his baseline.  He has been on Apixiban 2.5 mg recently.  He does not appear to be actively bleeding.  Discussion with the patient and his wife results in them not being interested in invasive evaluation at this time.  I think it is appropriate to consider continued medical therapy.  Patient is anxious to go home today a possible.    Plan #1.  Continue current medications including antiplatelet therapy, simvastatin. #2.  Ambulate as tolerated.  If he remains stable consider discharge with followup as an outpatient with Dr. Arnoldo Hooker #3 low-fat, low-cholesterol, low-sodium diet #4 would not recommend proceeding with invasive evaluation at this time   Electronic Signatures: Dalia Heading (MD)  (Signed 12-Aug-13 14:20)  Authored: General Aspect/Present Illness, History and Physical Exam, Review of System, Home Medications, EKG , Allergies, Impression/Plan   Last Updated: 12-Aug-13 14:20 by Dalia Heading (MD)

## 2014-09-18 NOTE — H&P (Signed)
PATIENT NAME:  Benjamin BlossomCARGILE, Jefry F MR#:  [id number] DATE OF BIRTH:  February 19, 1923  DATE OF ADMISSION:  04/25/2012  NO DICTATION    ____________________________ Hope PigeonVaibhavkumar G. Elisabeth PigeonVachhani, MD vgv:cbb D: 04/25/2012 17:21:39 ET T: 04/25/2012 17:56:50 ET JOB#: [id number]  cc:  Altamese DillingVAIBHAVKUMAR Dustine Bertini MD ELECTRONICALLY SIGNED 05/16/2012 22:46

## 2014-09-18 NOTE — Discharge Summary (Signed)
PATIENT NAME:  Benjamin Dominguez, Benjamin Dominguez MR#:  [id number] DATE OF BIRTH:  05-Oct-1922  DATE OF ADMISSION:  01/10/2012 DATE OF DISCHARGE:  01/11/2012  PRIMARY CARE PHYSICIAN: Dr. Sullivan LoneGilbert   DISCHARGE DIAGNOSES:  1. Chronic systolic heart failure.  2. Chest pain secondary to congestive heart failure.  3. Non-ST elevation myocardial infarction. 4. Atrial fibrillation. 5. Coronary artery disease.   6. History of nondisplaced C2 fracture. 7. Osteoarthritis. 8. History of cerebrovascular accident. 9. History of right lower extremity osteomyelitis on chronic Bactrim.  10. Hypothyroidism.   DISCHARGE MEDICATIONS:  1. Nitroglycerin sublingual 0.4 mg every five minutes p.r.n. for chest pain. 2. Levothyroxine 100 mcg p.o. daily.  3. Aspirin 81 mg daily.  4. Bactrim 1 tablet p.o. b.i.d.  5. Eliquis 2.5 mg p.o. b.i.d.  6. Fluocinonide topical ointment as needed for rash.  7. Losartan 50 mg daily. 8. Metoprolol 25 mg half tablet b.i.d.  9. Simvastatin 40 mg p.o. daily.  10. Lubricant eye drops. 11. Senna as needed.  12. Humulin N 22 units in the morning, 5 units at night.   DIET: Low sodium diet.   ACTIVITIES: No exertion of activity. No heavy weight lifting.  FOLLOW UP: Follow up with Dr. Gwen PoundsKowalski in 1 to 2 days.   CONSULTATION: Cardiology consult with Dr. Lady GaryFath.   HOSPITAL COURSE: [age] year old male with multiple medical problems admitted for chest pain. Patient had a chest pain that was relieved partly with nitroglycerin he had tightness and he is admitted for unstable angina. Patient's EKG did not reveal acute ST-T changes. Patient's troponins are elevated up to 0.72. Cardiology consult obtained with Dr. Gwen PoundsKowalski versus Dr. Lady GaryFath obtained. Patient's wife refused to have any intervention for his heart and wanted to go home. Patient advised to have medical treatment. Patient's wife not interested in invasive medical evaluation so he advised to continue medical therapy for his coronary artery disease.  Patient can continue aspirin, metoprolol, statins, losartan and can follow up with Dr. Gwen PoundsKowalski. Patient also has chronic osteomyelitis on chronic Bactrim and follows up with Dr. Hulda Marinimothy Oaks and patient's wife is taking him for the appointment. Patient is followed with hospice.   LABORATORY, DIAGNOSTIC AND RADIOLOGICAL DATA: Troponin initially 0.09, peaked up to 0.72. Other lab data which are significant ejection fraction is more than 55%, normal systolic function. Chest x-ray showed CABG, hypoinflation, mild edema and osteopenia. Electrolytes were stable. Hemoglobin 8.7 on admission, hematocrit 24.7 but he had no bleeding episodes and iron level 43, hemoglobin remained around 9.1, and hematocrit 25.4 again that was yesterday.   DISCHARGE INSTRUCTIONS: Patient advised to continue his home medications and [facility] and that can be resumed.   TIME SPENT ON DISCHARGE PREPARATION: More than 30 minutes.   ____________________________ Katha HammingSnehalatha Pearce [place], MD sk:cms D: 01/12/2012 08:17:39 ET T: 01/12/2012 13:36:21 ET JOB#: [id number]  cc: Katha HammingSnehalatha Cozetta Seif, MD, <Dictator> Richard L. Sullivan LoneGilbert, MD Katha HammingSNEHALATHA Geramy Lamorte MD ELECTRONICALLY SIGNED 01/27/2012 14:38

## 2014-09-18 NOTE — H&P (Signed)
PATIENT NAME:  Benjamin Dominguez, Benjamin Dominguez MR#:  [id number] DATE OF BIRTH:  11-26-1922  DATE OF ADMISSION:  04/25/2012  PRIMARY CARE PHYSICIAN: Richard L. Sullivan Lone, MD   CHIEF COMPLAINT: Speech problem.   HISTORY OF PRESENT ILLNESS: This is an 81 year old male with past medical history of psoriasis, cervical vertebral C2 fracture, nonhealing due to osteoporosis, osteoarthritis, cerebrovascular accident, multiple transient ischemic attacks in the past, peripheral vascular disease and gangrene on the left feet, history of atrial fibrillation, systolic congestive heart failure, diabetes mellitus, hypothyroidism, hyperlipidemia. He is, because of his multiple medical problems, on  DO NOT RESUSCITATE and on Hospice Care at home.  He living with family, his wife, who is Healthcare Proxy, and able to manage himself in day to day activity, walk with a walker. Today morning, wife noted that he had some speech problem, slurred speech, that is why she spoke to his Hospice Nurse, and she suggested to take him to the Emergency Room for possible stroke. I discussed in further details with his stepson, who is present in the room. He says that the speech problem was during the morning, which is improving now. Speech is much better, not to normal still. He denies any other weakness in the arms or legs or any functional limitation happened today because of this event. He denies any headache, nausea, vomiting, tremors or seizures. Denies any cough, fever, chest pain.    REVIEW OF SYSTEMS: CONSTITUTIONAL: No fever, fatigue. No weakness, pain, weight loss. EYES: No blurring or double vision. No redness or discharge. ENT: No tinnitus, hearing loss or discharge from the ears. RESPIRATORY: No cough, wheezing, hemoptysis. CARDIOVASCULAR: No chest pain, orthopnea, edema, or palpitations at night. GASTROINTESTINAL: No nausea, vomiting, diarrhea, or abdominal pain. GENITOURINARY: No dysuria, hematuria, or increased frequency. ENDOCRINE: No  intolerance to heat and cold. No polyuria or nocturia. MUSCULOSKELETAL: Has fractured cervical vertebra and has to use cervical collar but no change in the status, not any new pain or swelling in any joints. NEUROLOGICAL: Has some speech difficulty but other than that no weakness or tremors or numbness in any limbs. PSYCHIATRIC: No anxiety, insomnia or depression.     PAST MEDICAL HISTORY:  1. Psoriasis.  2. Osteoarthritis.  3. Multiple transient ischemic attacks. 4. Peripheral vascular disease. 5. Atrial fibrillation.  6. Osteomyelitis in right lower extremity. 7. Herpes zoster. 8. Type 1 diabetes mellitus.  9. Hypothyroidism,  10. Hyperlipidemia. 11. Status post left hip surgery.   PAST SURGICAL HISTORY:  1. Left hip surgery.  2. Cataract surgery bilaterally. 3. Bilateral carpal tunnel surgery.   ALLERGIES: ACE inhibitor and Aggrenox.   SOCIAL HISTORY: The patient is married. No history of alcohol or tobacco abuse. Lives with family, walks with a walker, on Hospice care at home.   FAMILY HISTORY: Positive for diabetes, coronary artery disease, stroke, hypertension, hyperlipidemia. Negative for poor colon or prostate cancer.   PHYSICAL EXAMINATION:  VITAL SIGNS: Temperature 97.8, pulse 80, respirations 18, blood pressure 136/66, and pulse oximetry 95 on room air.   GENERAL: He is fully alert and oriented and cooperative with examination, and he is very comfortable.   HEENT/NECK: There is a cervical collar present and hearing aid present. Head: Atraumatic. Conjunctivae pink. Oral mucosa moist. No JVD examined because of cervical collar.    RESPIRATORY: Bilateral clear and equal air entry.   CARDIOVASCULAR: S1, S2 present, regular.  ABDOMEN: Soft, nontender. Bowel sounds present. No organomegaly appreciated.   SKIN: No rashes. Right side lower extremity darkening and skin  changes present. Bilateral foot and leg immobility boots are present due to his decreased circulation and  gangrene to help him walk.  No edema on the legs appreciated.   NEUROLOGICAL: Power five out of five in all four limbs. Slight speech problem appreciated while examining him. Hearing difficulty, but he is able to hear when I speak loudly. Cranial nerves other than this grossly intact.   PSYCHIATRIC: Does not appear in any acute psychiatric problem at this point of time.   LABORATORY, DIAGNOSTIC AND RADIOLOGICAL DATA: Glucose 178, BUN 17, creatinine 0.83, sodium 135, potassium 4.3, chloride 100, CO2 31, calcium 8.5, total protein 6.4, albumin 2.3, bilirubin 0.2, alkaline phosphatase 127, SGOT 17, SGPT 15. Troponin 0.15. WBC 4.6. Hemoglobin 9.6, hematocrit 28.7, and platelet count 254, MCV 83.  PTT 39.9. INR is 1.0. Urinalysis is grossly negative. Chest x-ray: No definite acute cardiopulmonary abnormality.  The  interstitial marking is slightly more conspicuous other than frontal film today but has not significantly changed on the lateral film. CT of the head without contrast:  Chronic and involutional changes without evidence of acute abnormalities.   ASSESSMENT AND PLAN:  1. Transient ischemic attack: The patient has a history of multiple cerebrovascular accidents and transient ischemic attacks. He is already on aspirin, and he is on Home Hospice due to multiple medical problems and old age. I spoke to his son and grandson who were present in the room. I discussed with them about the regular routine work-up that we do in transient ischemic attack patients, including MRI, echocardiogram, and carotid Doppler. His echocardiogram was done three months ago, so I will not repeat it at this time carotid Doppler.  I discussed with them about the possibility of findings, and if we find a blockage then he might need to go for surgery of carotid endarterectomy or stenting. They are not in agreement to go for this surgery. They say that he is too old and has multiple medical problems to go for any surgeries or  procedures; so, they will not go for any even if we find something on carotid Doppler studies. His symptoms are already improving, and he does not have any other new weakness in arms or legs or numbness to decrease his functional status; so finally, after discussion with family, son Tresa Endo, and his grandson, we came to agreement that we will not get MRI because it will not give any  useful information for further management. We will not get carotid Doppler because it will not help in further management anyway if his family does not want any procedure or surgery for him. An echocardiogram is done recently three months ago, so we will not that one. Other than that, I will keep him under observation, get his swallow evaluation to decide if he needs any change in his diet or any kind of help with that to lead a better and active life than what he has been living at home with Hospice care. I will also get Physical Therapy evaluation as they say that he is walking with a walker. He has cervical fracture, and he has a hard cervical collar for that. So, if he needs any kind of physical therapy or extra support for that to decide those things that might help him to lead an independent and active functional life while he continues receiving Hospice care, I discussed this thing in detail with family and they are in agreement with this.  2. Atrial fibrillation:  I will continue aspirin.  3.  Diabetes mellitus: We will check glucose levels 3 times a day and give insulin regular for coverage.  4. Hypothyroidism: We will give levothyroxine.  5. Hypertension: We will give metoprolol.   6. Deep vein thrombosis prophylaxis with heparin subcutaneous and GI prophylaxis with pantoprazole orally.  7. He is currently not have any signs of congestive heart failure, so we will not change any medications what he is taking at home.   CODE STATUS: DO NOT RESUSCITATE, confirmed with the son. Healthcare proxy is the patient's wife.    TOTAL TIME SPENT:  55 minutes.   ____________________________ Hope PigeonVaibhavkumar G. Elisabeth PigeonVachhani, MD vgv:cbb D: 04/25/2012 17:21:00 ET T: 04/25/2012 18:00:08 ET JOB#: [id number]  cc: Hope PigeonVaibhavkumar G. Elisabeth PigeonVachhani, MD, <Dictator> Richard L. Sullivan LoneGilbert, MD Altamese DillingVAIBHAVKUMAR Harkirat Orozco MD ELECTRONICALLY SIGNED 05/16/2012 22:46

## 2014-09-18 NOTE — H&P (Signed)
PATIENT NAME:  Benjamin Dominguez, Benjamin Dominguez MR#:  [id number] DATE OF BIRTH:  1922/10/23  DATE OF ADMISSION:  01/10/2012  ER REFERRING PHYSICIAN: Glennie IsleSheryl Gottlieb, MD  FAMILY PHYSICIAN: Julieanne Mansonichard Gilbert, MD  REASON FOR ADMISSION: Prolonged chest pain worrisome for unstable angina.   HISTORY OF PRESENT ILLNESS: The patient is an 81 year old male followed by Dr. Sullivan LoneGilbert as well as Dr. Gwen PoundsKowalski from a cardiology standpoint with a significant history of coronary artery disease status post bypass surgery in 1988. The patient also has a history of type 1 diabetes and hyperlipidemia. He presents to the Emergency Room after developing substernal chest pain at rest described as pressure and tightness which was nonradiating. Pain was rated 7 out of 10 with no associated shortness of breath, nausea, or diaphoresis. It stayed in his chest. His wife gave him sublingual nitroglycerin with no improvement. EMS was called. He was brought to the Emergency Room where his pain subsided after two hours spontaneously. He is now chest pain free. In the Emergency Room, the patient's troponin was mildly elevated and he was found to be profoundly anemic. He is currently asymptomatic but is admitted for further evaluation.   PAST MEDICAL HISTORY:  1. Atherosclerotic cardiovascular disease status post coronary artery bypass graft in 1988.  2. Recent nondisplaced C2 fracture.  3. History of MRSA.  4. Psoriasis.  5. Osteoarthritis.  6. Status post CVA x2.  7. Multiple transient ischemic attacks.  8. Peripheral vascular disease.  9. History of atrial fibrillation.  10. History of systolic congestive heart failure.  11. History of right lower extremity osteomyelitis.  12. History of herpes zoster.  13. Type 1 diabetes. 14. Hypothyroidism. 15. Hyperlipidemia.  16. Status post left hip surgery. 17. Status post bilateral cataract surgery.  18. Status post bilateral carpal tunnel surgeries.   MEDICATIONS: 1. Zocor 40 mg p.o. at  bedtime.  2. Novolin N 10 units subcutaneous twice a day. 3. Nitrostat p.r.n. chest pain. 4. Risperdal 0.4 mg p.o. every 6 hours p.r.n. agitation. 5. Lopressor 12.5 mg p.o. twice a day. 6. Cozaar 50 mg p.o. daily. 7. Synthroid 100 mcg p.o. daily. 8. Eliquis 2.5 mg p.o. twice a day. 9. Septra DS one p.o. twice a day. 10. Claritin 5 mg p.o. daily. 11. Aspirin 81 mg p.o. daily.  DRUG ALLERGIES: ACE inhibitors and Aggrenox.   SOCIAL HISTORY: The patient is married. No history of alcohol or tobacco abuse.   FAMILY HISTORY: Positive for diabetes, coronary artery disease, stroke, hypertension, and hyperlipidemia. Negative for colon or prostate cancer.   REVIEW OF SYSTEMS: CONSTITUTIONAL: No fever or change in weight. EYES: No blurred or double vision. No glaucoma. ENT: No tinnitus or hearing loss. No nasal discharge or bleeding. No difficulty swallowing. RESPIRATORY: No cough or wheezing. Denies hemoptysis or painful respiration. CARDIOVASCULAR: No orthopnea or palpitations. No syncope. GASTROINTESTINAL: No nausea, vomiting or diarrhea. No abdominal pain. No change in bowel habits. GENITOURINARY: No dysuria or hematuria. No incontinence. ENDOCRINE: No polyuria or polydipsia. No heat or cold intolerance. HEMATOLOGIC: The patient denies anemia, easy bruising, or bleeding. LYMPHATIC: No swollen glands. MUSCULOSKELETAL: The patient has pain in his neck and his back, but denies pain in his shoulders, knees, or hips. No gout. NEUROLOGIC: No numbness or weakness. Denies migraines. No seizures. PSYCH: The patient denies anxiety, insomnia, or depression.   PHYSICAL EXAMINATION:   GENERAL: The patient is chronically ill-appearing but in no acute distress.   VITAL SIGNS: Vital signs are currently remarkable for a blood pressure of 101/58  with a heart rate of 85 and a respiratory rate of 20. He is afebrile.   HEENT: Normocephalic, atraumatic. Pupils equally round and reactive to light and accommodation.  Extraocular movements are intact. Sclerae anicteric. Conjunctivae are clear. Oropharynx is clear.   NECK: Supple without jugular venous distention. No adenopathy or thyromegaly is noted.   LUNGS: Scattered rhonchi without wheezes or rales. No dullness.   HEART: Regular rate and rhythm with normal S1 and S2. There was a 2/6 systolic murmur noted. No rubs or gallops are present.   ABDOMEN: Soft and nontender with normoactive bowel sounds. No organomegaly or masses were appreciated. No hernias or bruits were noted.   EXTREMITIES: Stasis changes with 1+ edema. Dry gangrene to the right foot was noted. Pulses were trace bilaterally.   SKIN: Warm and dry without rash or lesions.   NEUROLOGIC: Cranial nerves II through XII grossly intact. Deep tendon reflexes were symmetric. Motor and sensory examination is nonfocal.   PSYCH: The patient was alert and oriented to person, place, and time. He was cooperative and used good judgment.   LABS/RADIOLOGIC STUDIES: Glucose was 280 with a BUN of 14 and a creatinine of 0.96 with a sodium of 135 and a potassium of 4.8 with a GFR of greater than 60. His total CK was 39 with a MB of 1.6. Troponin was 0.09. White count was 5.2 with a hemoglobin of 8.7 and a MCV of 86.   EKG revealed sinus rhythm with a right bundle branch block with no acute ischemic changes.   Chest x-ray revealed interstitial thickening compatible with chronic systolic congestive heart failure and cardiomegaly.   ASSESSMENT:  1. Prolonged chest pain worrisome for unstable angina.  2. Normocytic anemia of unclear etiology.  3. Type 1 diabetes mellitus.  4. Mild hyponatremia.  5. Chronic systolic congestive heart failure.  6. History of paroxysmal atrial fibrillation.  7. Atherosclerotic cardiovascular disease status post coronary artery bypass graft.  8. History of CVA x2.  9. History of multiple transient ischemic attacks.  10. Hypothyroidism.  11. Psoriasis.  12. Nondisplaced  C-spine fracture wearing a cervical collar at this time.   PLAN: The patient will be admitted to telemetry for unstable angina and maintained on Lovenox, aspirin, and beta blocker therapy. We will hold off on topical nitrates at this time given the patient's low blood pressure. We will monitor him on telemetry. We will follow serial cardiac enzymes and obtain an echocardiogram. We will consult Dr. Gwen Pounds in the morning who is his regular cardiologist for further treatment and evaluation. In regards to his anemia, we will guaiac all stools and send off anemia labs. Repeat his hemoglobin in the morning. Watch carefully as he is on Lovenox for acute bleeding. We will follow his sugars with Accu-Cheks before meals and at bedtime and add sliding scale insulin as needed. We will continue his twice a day NPH at this time. We will also continue his Synthroid at this time. We will supplement oxygen and wean as tolerated. Further treatment and evaluation will depend upon the patient's progress.   TOTAL TIME SPENT ON THIS PATIENT: 50 minutes. ____________________________ Duane Lope Judithann Sheen, MD jds:slb D: 01/10/2012 14:40:33 ET    T: 01/10/2012 15:39:00 ET       JOB#: [id number] cc: Duane Lope. Judithann Sheen, MD, <Dictator> Richard L. Sullivan Lone, MD Suleima Ohlendorf Rodena Medin MD ELECTRONICALLY SIGNED 01/11/2012 8:03

## 2014-09-18 NOTE — Discharge Summary (Signed)
PATIENT NAME:  Benjamin Dominguez, Benjamin Dominguez MR#:  [id number] DATE OF BIRTH:  06-07-1922  DATE OF ADMISSION:  04/25/2012 DATE OF DISCHARGE:  04/26/2012  ADMISSION DIAGNOSIS: Transient ischemic attack.  DISCHARGE DIAGNOSES: 1. Transient ischemic attack. 2. History of atrial fibrillation.  3. History of diabetes.  4. History of hypertension.   CONSULTANTS: None.   DISCHARGE LABS/STUDIES: Cholesterol 103, triglycerides 55, and LDL 53.   CT of the head showed no acute intracranial hemorrhage or cerebrovascular accident.   HISTORY OF PRESENT ILLNESS: This is an 81 year old male who was brought in with speech disturbances and found to have a transient ischemic attack. The family did not want any further work-up except for observation overnight. The patient was admitted for a transient ischemic attack. Please refer to the history and physical as dictated by Dr. Altamese DillingVaibhavkumar Vachhani.  1. Transient ischemic attack. The patient's family has elected on least amount of work-up. Symptoms have resolved. The patient will continue his current medications. Of note, the patient's wife said the patient did well on Plavix, however, this was recently discontinued by Dr. Gwen PoundsKowalski for the atrial fibrillation. This may be revisited by Dr. Gwen PoundsKowalski at a later date. 2. Atrial fibrillation. The patient will continue on current medications.  3. Diabetes. The patient will continue on his outpatient medications.  4. Hypertension. Well controlled.   DISCHARGE MEDICATIONS:  1. Nitrostat 0.4 mg sublingual p.r.n. chest pain.  2. Levothyroxine 0.1 mg daily.  3. Tylenol 650 mg every four hours p.r.n. pain.  4. Aspirin 81 mg daily.  5. Bactrim 800/160 mg twice a day.  6. Eliquis 2.5 mg twice a day.  7. Fluocinonide topical cream p.r.n. for rash.  8. Lubricant eye drops one drop to each affected eye daily.  9. Metoprolol 25 mg 1/2 tablet twice a day.  10. Simvastatin 40 mg at bedtime.  11. Humulin 20 to 24 units daily, 5 units at  bedtime and more if needed.   DISCHARGE HOME HEALTH: Yes with physical therapy and nurse.  DISCHARGE DIET: Low sodium, regular consistency.  DISPOSITION: Discharge with hospice.   DISCHARGE FOLLOWUP: Follow-up in 1 to 2 weeks with Dr. Sullivan LoneGilbert.   The patient was discharged in stable condition.  TIME SPENT: Approximately 35 minutes. ____________________________ Janyth ContesSital P. Juliene PinaMody, MD spm:slb D: 04/26/2012 12:45:41 ET (Entered as incorrect work type - 02) T: 04/26/2012 13:03:38 ET JOB#: [id number]  cc: Shree Espey P. Juliene PinaMody, MD, <Dictator> Richard L. Sullivan LoneGilbert, MD Janyth ContesSITAL P Alpha Mysliwiec MD ELECTRONICALLY SIGNED 04/26/2012 13:41

## 2014-09-21 NOTE — Discharge Summary (Signed)
PATIENT NAME:  Benjamin Dominguez, Benjamin Dominguez MR#:  [id number] DATE OF BIRTH:  1923/03/10  DATE OF ADMISSION:  08/29/2012 DATE OF DISCHARGE:  09/02/2012  ADMITTING DIAGNOSIS: Chest pain and altered mental status.   DISCHARGE DIAGNOSES: 1.  Chest pain due to non-ST myocardial infarction. The patient was evaluated by cardiology, had an echocardiogram done which showed an old infarct. He is not considered a candidate for any intervention. Conservative management is recommended.  2.  Acute encephalopathy felt to be metabolic due to hypoglycemia as well as urinary retention. The patient's mental status is improved.  3.  History of chronic atrial fibrillation, rate controlled. He is continued on Lopressor. The patient was on anticoagulation prior to admission, which has been discontinued due to his fall risk.  4.  Acute renal failure due to dehydration, now resolved.  5.  Diabetes, which is very labile with hypoglycemia at nighttime and hyperglycemia during the day. His evening time insulin has been discontinued.  6.  Hypothyroidism. He is on Synthroid.  7.  Hyperlipidemia.  8.  Dementia with progression.  9.  Chronic foot infection. He is on Bactrim for therapy for that, status post wound care evaluation.  10.  Abdominal distention due to urinary retention. The patient has a Foley in place. He will need outpatient followup with urology. He is started on Flomax.  11.  Multivalvular disease based on his echocardiogram.   PERTINENT LABS AND EVALUATIONS: Admitting WBC count 7.3, hemoglobin 10.7 and platelet count was 215. TSH was 5.24.   Blood cultures x 2 no growth. Urine culture showed mixed results.   Cortisol level 29.1.   Echocardiogram of the heart showed LVF of 30% to 35%, moderate posterolateral MI, moderately dilated left atrium, mildly dilated right atrium, degenerative mitral valve, moderate to severe mitral valve regurg, thickening of the anterior and posterior mitral valve leaflets, mildly increased  LVH, mild aortic regurg, moderate aortic valve stenosis, moderate to severe tricuspid regurg.   His CT scan of the head on admission showed chronic and involutional changes without any acute abnormality.    Abdominal x-ray showed concern for possible large density filling the lower abdomen and pelvis which could represent extremely distended urinary bladder or soft tissue mass.   HOSPITAL COURSE: Please refer to the H and P done by the admitting physician. The patient is an 81 year old white male who was brought from home with lethargy and also chest pain. The patient was noted to have elevated cardiac enzymes with a troponin of 5.07. He was admitted to the hospital with non-ST MI as well as acute encephalopathy. In terms of his non-ST MI, based on his multiple medical problems, he was conservatively treated with Lovenox for 3 days. He had an echo which did confirm he has cardiomyopathy as well as a large old infarct. He has severe valvular disease. He was seen by Dr. Gwen PoundsKowalski during the hospitalization. His wife understands that he is not a candidate for intervention. In terms of his acute encephalopathy, it was felt to be multifactorial including hypoglycemia and urinary retention. His mental status is improved. The patient also has history of A-fib and he was on Eliquis for chronic anticoagulation, but he is not felt to be a good anticoagulation candidate. Therefore, this was stopped. The patient also had acute renal failure on admission which resolved. The patient also has urinary retention. He will follow up outpatient with urology and he was started on Flomax. At this time, he is stable for discharge.   DISCHARGE CONDITION:  Satisfactory.   CODE STATUS: DO NOT RESUSCITATE.   DISCHARGE MEDICATIONS:  1.  Nitrostat 0.4 mg sublingual p.r.n. chest pain. 2.  Levothyroxine 100 mcg daily. 3.  Tylenol 650 mg q. 4 hours p.r.n.  4.  Aspirin 81 mg 1 tab p.o. daily. 6.  Bactrim 1 tab p.o. q. 12 hours. 7.   Fluocinonide 0.05% apply topically to affected area as needed for rash. 8.  Eye lubricants 1 drop to each eye daily. 9.  Metoprolol tartrate 12.5 mg b.i.d. 10.  Isosorbide mononitrate 30 mg daily.  11.  Citalopram 10 mg daily. 12.  NPH 20 units at bedtime.  13.  Sliding scale insulin.  14.  Simvastatin 40 mg at bedtime. 15.  Lasix 40 mg 1 tab p.o. b.i.d. 16.  Ensure 2 times a day. 17.  Flomax 0.4 mg daily. 18.  Colace 100 mg 1 tab p.o. b.i.d. p.r.n.   DIET: Low sodium, low fat, low carbohydrate. Diet consistency regular.  ACTIVITY: As tolerated.  DISCHARGE FOLLOWUP: With Dr. Sullivan Lone in 1 to 2 weeks. Follow with urology for urinary retention in 1 to 2 weeks. Keep Foley in until seen by urology.  TIME SPENT:  35 minutes. ____________________________ Lacie Scotts Allena Katz, MD shp:sb D: 09/02/2012 10:48:44 ET T: 09/02/2012 11:07:58 ET JOB#: [id number]  cc: Domique Clapper H. Allena Katz, MD, <Dictator> Charise Carwin MD ELECTRONICALLY SIGNED 09/04/2012 19:37

## 2014-09-21 NOTE — Discharge Summary (Signed)
PATIENT NAME:  Benjamin Dominguez, Benjamin Dominguez MR#:  [id number] DATE OF BIRTH:  1923-01-14  DATE OF ADMISSION:  01/01/2013 DATE OF DISCHARGE:  01/02/2013  ADMITTING PHYSICIAN: Rolly Pancake. Cherlynn Kaiser, MD   DISCHARGING PHYSICIAN: Enid Baas, MD  PRIMARY CARE PHYSICIAN: Richard L. Sullivan Lone, MD  CONSULTATIONS IN THE HOSPITAL: Palliative care consultation by Dr. Harriett Sine Phifer.   DISCHARGE DIAGNOSES: 1.  Acute encephalopathy, likely secondary to progressively worsening dementia.  2.  Coronary artery disease, status post bypass graft surgery.  3.  Congestive heart failure, known ejection fraction of 35%.  4.  History of multiple transient ischemic attacks.  5.  Chronic bilateral lower extremity osteomyelitis with gangrene, not amenable for surgery.  6.  Recent fall and cervical spine fracture, in a cervical collar.  7.  Diabetes.  8.  Hypertension.  9.  Hyperlipidemia.  10.  Depression.  11.  Benign prostatic hypertrophy.  12.  Glaucoma.  13.  Hypothyroidism.   DISCHARGE MEDICATIONS:   1.  Nitroglycerin sublingual 0.4 mg tablet q.5 minutes p.r.n. for chest pain.  2.  Levothyroxine 100 mcg p.o. daily.  3.  Metoprolol 12.5 mg p.o. b.i.d.  4.  Celexa 10 mg p.o. daily.  5.  Simvastatin 40 mg p.o. daily.  6.  Plavix 75 mg p.o. daily.  7.  Preserved ophthalmic solution, Isopto Tears, 1 drop both eyes once a day.  8.  Flomax 0.4 mg p.o. daily.  9.  Timolol 0.5% ophthalmic solution, 1 drop in left eye at bedtime.  10.  Aspirin 81 mg p.o. daily.  11.  Doxycycline 100 mg p.o. b.i.d.  12.  Iodosorb 0.9% topical gel to affected area Monday, Wednesday, Friday to both feet.  13.  Tylenol 650 mg p.o. q.4 hours p.r.n. for pain or fever.  14.  Ensure 237 mL 3 times a day with meals, chocolate flavor.  15.  Roxanol 20 mg/ mL, 0.25 mL q.4 hours p.r.n. for pain or shortness of breath.  16.  Ativan 0.5 mg tablet every 6 hours as needed for anxiety and agitation.   DISCHARGE DIET: Regular diet.   DISCHARGE  ACTIVITY: As tolerated.   FOLLOWUP INSTRUCTIONS: The patient is being discharged to hospice home.   LABORATORY AND IMAGING STUDIES: Urinalysis negative for any infection.   WBC 5.8, hemoglobin 12.3, hematocrit 35.0, platelet count 221.   Sodium 134, potassium 4.7, chloride 99, bicarbonate 33, BUN 22, creatinine 0.69, glucose 218 and calcium of 8.8.   ALT 19, AST 28, alkaline phosphatase 72, total bilirubin 0.4 and albumin of 3.5.  Troponin slightly elevated at 0.18.   Chest x-ray revealing shallow inspiration. No evidence of acute cardiopulmonary disease.   CT of the head showing diffuse cortical and cerebellar atrophy with areas periventricular white matter changes indicative of chronic abnormality. No acute changes seen.   BRIEF HOSPITAL COURSE: Mr. Benjamin Dominguez is an 81 year old Caucasian male with multiple medical problems including coronary artery disease, status post bypass graft surgery, cardiomyopathy with CHF with EF of 30% to 35%, valvular disease, A. fib, history of CVA, peripheral vascular disease, chronic bilateral lower extremity gangrene and osteomyelitis not amenable for surgery, recent C-spine fracture in a cervical collar, worsening dementia, diabetes mellitus, TIAs, whose  last hospitalization was in April 2014, was discharged to [facility] and then to home and has been followed by hospice. He has been living at home with his wife, who has been taking care of him along with the help of hospice. However, the patient's mental status is slowly worsening, especially over  the last couple of days. He is not talking that much. He used to be communicating to the family and walking with a walker. However, things have slowed down in all aspects and the patient is refusing to eat, so he was brought to the hospital.   Acute encephalopathy: Likely progressive dementia. Initially thought to be TIA. Family, however, did not want further aggressive work-up like getting an MRI. CT of the head did show  diffuse cortical and cerebellar atrophy. The patient's wife was under the impression that the patient is giving up. He has suffered a lot and is going through a lot of medical problems which cannot be fixed. Palliative care consult was requested and they have talked to the patient's wife, to the patient and also hospice liaison, and the patient has opted for hospice home at this time. All of the patient's other home medications are being continued at this time, along with Roxanol and Ativan p.r.n. for comfort. The patient's course has been otherwise uneventful in the hospital. He was speaking up a little bit better than yesterday. He was oriented x 1 to 2 at the time of discharge.   DISCHARGE CONDITION: Guarded with poor prognosis.   DISCHARGE DISPOSITION: Hospice home.   TIME SPENT ON DISCHARGE: 40 minutes.   ____________________________ Enid Baasadhika Kaylan Yates, MD [id number] D: 01/02/2013 13:34:56 ET T: 01/02/2013 14:28:07 ET JOB#: [id number]  cc: Enid Baasadhika Reyansh Kushnir, MD, <Dictator> Enid BaasADHIKA Isaias Dowson MD ELECTRONICALLY SIGNED 01/02/2013 15:01

## 2014-09-21 NOTE — Consult Note (Signed)
General Aspect 81 year old male with history of CAD status post bypass graft in 1988 with 4 grafts, diabetes mellitus type 2 hypertension, hyperlipidemia and PVD with previous TIAs as well as chronic gangrenous foot mild aortic stenosis and chronic A. fib that presented after a weekend of  acute mental status change as well as hyperglycemia for blood glucose running in the 400- 500s.  Patient also had an episode of chest discomfort Sunday night that lasted approximately 15 min. relieved with 2  nitroglycerin.  Patient was seen by his PCP on Monday and sent to the emergency room for further treatment.  Troponin initially was over 5.  Today it is trending down to 3.3.  Due to patient's advanced age and his multiple medical issues he is on conservative therapy.  Patient this a.m. while earlier confused, did respond  more appropriate with me during the interview.  Chest x-ray suggested mild congestive heart failure.  There were no acute changes by CT scan of the head.  Patient has had multiple falls at homeand has been on Eloquis due to chronic A. fib.  However, with ongoing  dementia and numerous falls it will not be restarted on discharge. he has not had any further chest pain since hospitalized.  He does have some mild renal insufficiency.   Physical Exam:  GEN no acute distress   HEENT pale conjunctivae, hearing intact to voice   RESP normal resp effort   CARD Irregular rate and rhythm  No murmur   ABD denies tenderness   EXTR negative edema, chronic gangrenous lesion of right foot   SKIN normal to palpation, skin turgor poor   NEURO cranial nerves intact, motor/sensory function intact   PSYCH responded appropriately to questions   Review of Systems:  Subjective/Chief Complaint Confusion and hyperglycemia chest pain   Review of Systems: All other systems were reviewed and found to be negative   ROS Pt not able to provide ROS   Lab Results: Thyroid:  31-Mar-14 15:26   Thyroid  Stimulating Hormone  5.24 (0.45-4.50 (International Unit)  ----------------------- Pregnant patients have  different reference  ranges for TSH:  - - - - - - - - - -  Pregnant, first trimetser:  0.36 - 2.50 uIU/mL)  Hepatic:  31-Mar-14 15:26   Bilirubin, Total 0.2  Alkaline Phosphatase 118  SGPT (ALT) 21  SGOT (AST) 30  Total Protein, Serum 7.5  Albumin, Serum  3.1  Routine Chem:  31-Mar-14 15:26   Result Comment TROPONIN - RESULTS VERIFIED BY REPEAT TESTING.  - CALLED TO JARRELL HILL @ 1716 ON 08/30/18  - 14/CAF  - READ-BACK PROCESS PERFORMED.  Result(s) reported on 29 Aug 2012 at 04:54PM.  Hemoglobin A1c [facility])  9.6 (The American Diabetes Association recommends that a primary goal of therapy should be <7% and that physicians should reevaluate the treatment regimen in patients with HbA1c values consistently >8%.)  Glucose, Serum  444  BUN  34  Creatinine (comp)  1.46  Sodium, Serum  133  Potassium, Serum 4.7  Chloride, Serum 99  CO2, Serum 29  Calcium (Total), Serum 9.0  Osmolality (calc) 293  eGFR (African American)  49  eGFR (Non-African American)  42 (eGFR values <27m/min/1.73 m2 may be an indication of chronic kidney disease (CKD). Calculated eGFR is useful in patients with stable renal function. The eGFR calculation will not be reliable in acutely ill patients when serum creatinine is changing rapidly. It is not useful in  patients on dialysis. The eGFR calculation  may not be applicable to patients at the low and high extremes of body sizes, pregnant women, and vegetarians.)  Anion Gap  5  Cardiac:  31-Mar-14 15:26   Troponin I  5.07 (0.00-0.05 0.05 ng/mL or less: NEGATIVE  Repeat testing in 3-6 hrs  if clinically indicated. >0.05 ng/mL: POTENTIAL  MYOCARDIAL INJURY. Repeat  testing in 3-6 hrs if  clinically indicated. NOTE: An increase or decrease  of 30% or more on serial  testing suggests a  clinically important change)  Routine Hem:  31-Mar-14  15:26   WBC (CBC) 7.3  RBC (CBC)  3.76  Hemoglobin (CBC)  10.7  Hematocrit (CBC)  32.0  Platelet Count (CBC) 215  MCV 85  MCH 28.5  MCHC 33.5  RDW  14.7  Neutrophil % 80.5  Lymphocyte % 10.9  Monocyte % 7.9  Eosinophil % 0.2  Basophil % 0.5  Neutrophil # 5.9  Lymphocyte #  0.8  Monocyte # 0.6  Eosinophil # 0.0  Basophil # 0.0 (Result(s) reported on 29 Aug 2012 at 04:36PM.)   Radiology Results: XRay:    31-Mar-14 16:28, Chest PA and Lateral  Chest PA and Lateral   REASON FOR EXAM:    high blood sugar coughing  COMMENTS:       PROCEDURE: DXR - DXR CHEST PA (OR AP) AND LATERAL  - Aug 29 2012  4:28PM     RESULT: Comparison is made to the previous examination dated 25 April 2012.    CABG changes are present. Severe degenerative changes are seen in the   right shoulder. Patchy areas of increased density and diffuse   interstitial thickening with peribronchial cuffing is present. Small   bilateral pleural effusions are present. Bony structures are otherwise  unremarkable.    IMPRESSION:   1. CABG changes. Changes suggestive of diffuse interstitial edema with   small effusions concerning for congestive heart failure. Correlate with   clinical and laboratory data. Severe degenerative changes are noted in  the right shoulder.    Dictation Site: 6        Verified By: Sundra Aland, M.D., MD  CT:    31-Mar-14 16:16, CT Head Without Contrast  CT Head Without Contrast   REASON FOR EXAM:    freq falls  COMMENTS:       PROCEDURE: CT  - CT HEAD WITHOUT CONTRAST  - Aug 29 2012  4:16PM     RESULT: Technique: Helical noncontrasted 5 mm sections were obtained from   the skull base through the vertex.    Findings: Diffuse cortical and cerebellar atrophy is identified as well   as diffuse areas of low attenuation within the subcortical, deep and   periventricular white matter regions. There is not evidence of   intra-axial nor extra-axial fluid collections, acute  hemorrhage, mass   effect, nor a depressed skull fracture. The visualized paranasal sinuses   and mastoid air cells are patent.  IMPRESSION:  Chronic and involutional changes without evidence of acute   abnormalities. If there is persistent clinical concern further evaluation   with MRI is recommended.   2. Comparison was made to prior study dated 04/25/2012.          Thank you for the opportunity to contribute to the care of your patient.        Verified By: Mikki Santee, M.D., MD    Prinivil: Unknown  Aggrenox: Unknown  Vital Signs/Nurse's Notes: **Vital Signs.:   01-Apr-14 10:54  Temperature  Temperature (F) 98  Celsius 36.6  Temperature Source oral  Pulse Pulse 333  Systolic BP Systolic BP 832  Diastolic BP (mmHg) Diastolic BP (mmHg) 65  Mean BP 86  Pulse Ox % Pulse Ox % 96  Pulse Ox Activity Level  At rest  Oxygen Delivery 4L    Impression 1. Nonstemi associated with hyperglycemia and AMS.   Plan 1.  Conservative approach for NSTEMI due to patient's complex medical history and advanced age. 2.  Continue Lovenox for one to 2 more days. 3.  Would not recommend Eloquis on discharge due to patient's mental status changes and frequent falls. 4.  Results of echocardiogram are pending. 5.  Continue  current medical management for control of atrial fibrillation. 6.  Further recommendations per Dr. Nehemiah Massed once the results of the echocardiogram are known.   Electronic Signatures: Roderic Palau (NP)  (Signed 01-Apr-14 14:08)  Authored: General Aspect/Present Illness, History and Physical Exam, Review of System, Labs, Radiology, Allergies, Vital Signs/Nurse's Notes, Impression/Plan   Last Updated: 01-Apr-14 14:08 by Roderic Palau (NP)

## 2014-09-21 NOTE — H&P (Signed)
PATIENT NAME:  Benjamin Dominguez, Benjamin Dominguez MR#:  [id number] DATE OF BIRTH:  Sep 08, 1922  DATE OF ADMISSION:  01/01/2013  PRIMARY CARE PHYSICIAN: Dr. Julieanne Manson.   CHIEF COMPLAINT: Altered mental status/slurred speech.   HISTORY OF PRESENT ILLNESS: This is an 81 year old male who presents to the hospital from home due to altered mental status and slurred speech. The patient herself has some baseline borderline dementia; therefore most of the is history obtained from the family at bedside. As per the family and wife the patient this morning was feeling increasingly weak. He is normally able to get around with the help of a walker. When she got the breakfast he could barely eat a couple  bites of his food and then kind of slouched over and was too weak to even get out of the chair. The patient apparently has had similar symptoms like this before where he was thought to have a TIA. The wife was therefore concerned. She called the patient's hospice nurse who recommended bringing the patient to the ER. The patient's mental status since coming to the hospital has slightly improved, but he is not back down to baseline yet. Hospitalist service has been consulted for further treatment and evaluation.   REVIEW OF SYSTEMS: Otherwise unobtainable given the patient's mental status and dementia.   PAST MEDICAL HISTORY: Consistent with diabetes, hypertension, hyperlipidemia, BPH, history of multiple TIAs. Depression. Glaucoma. Hypothyroidism.   ALLERGIES: AGGRENOX AND PRINIVIL.   SOCIAL HISTORY: No smoking. No alcohol abuse. No illicit drug abuse. Lives at home with his wife.   FAMILY HISTORY: The patient's mother had pancreatic cancer. Dad had an MI.   Patient's current medications are as follows: Aspirin 81 mg daily, Celexa 10 mg daily, Plavix  75 mg daily, doxycycline 100 mg b.i.d., Ensure 240 mg b.i.d., Lasix 40 mg daily, regular insulin, sliding-scale; Iodosorb 0.9% topical gel to be applied to the affected area  Monday, Wednesday and Friday to the foot, Artificial Tears as needed, Synthroid 100 mcg daily, metoprolol tartrate 12.5 mg b.i.d., sublingual nitroglycerin as needed, Senokot 1 tablet b.i.d., simvastatin 40 mg at bedtime, Flomax 0.4 mg daily, Timolol 0.5%, 1 drop to left eye at bedtime.   PHYSICAL EXAMINATION ON ADMISSION: Is as follows:  VITAL SIGNS: Are noted to be temperature 97.6, pulse 74, respirations 14, blood pressure 162/78, sats 98% on room air.  GENERAL: The patient is a pleasant-appearing male, lethargic, but in no apparent distress. HEAD, EYES, EARS, NOSE, THROAT: The patient is atraumatic, normocephalic. Extraocular muscles are intact. Pupils are equal and reactive to light. Sclerae anicteric. No conjunctival injection. No pharyngeal erythema.  NECK: Supple. There is no jugular venous distention. No bruits. No lymphadenopathy, no thyromegaly.  HEART: Regular rate and rhythm. He does have a 2/6 systolic ejection murmur heard at the left sternal border. No rubs, no clicks.  LUNGS: Clear to auscultation bilaterally. No rales, rhonchi. No wheezes. Poor respiratory effort.  ABDOMEN: Soft, flat, nontender, nondistended. Has good bowel sounds. No hepatosplenomegaly appreciated.  EXTREMITIES: No evidence of any cyanosis, clubbing, or peripheral edema. Has +2 pedal and radial pulses bilaterally.  NEUROLOGICAL: The patient is alert, awake, and oriented x 1, globally weak. Difficult to do a full neurological exam. Moves all extremities spontaneously.  SKIN: Moist and warm, with no rashes.  LYMPHATIC: There is no cervical lymphadenopathy.   LABORATORY EXAM: Showed a serum glucose of 218, BUN 22, creatinine 0.6, sodium 134, potassium 4.7, chloride 99, bicarb 33. The patient's LFTs are within normal limits. Troponin  0.18.   White cell count 5.8, hemoglobin 12.2, hematocrit 35.0, platelet count 221. Urinalysis is within normal limits.   The patient did have a CT of the head done without contrast  which showed chronic involutional changes, without evidence of acute abnormalities. The patient also had a chest x-ray done which showed shallow inspiration, without any evidence of acute cardiopulmonary disease.   ASSESSMENT AND PLAN: This is an 81 year old male with a history of diabetes, hypertension, hypothyroidism, history of multiple transient ischemic attacks, glaucoma, depression, hyperlipidemia, benign prostatic hypertrophy, who presents to the hospital due to altered mental status and slurred speech.    1.  Altered mental status/slurred speech: The exact etiology is unclear. Suspected to be a transient ischemic attack versus deconditioning, with his underlying dementia. Initial CT of the head is negative. The patient's family does not want to pursue aggressive workup, therefore will not order MRI of the carotids and echocardiogram at this time. Will follow q.4 hour neuro checks. Will observe him overnight. Continue his Plavix. Continue statin. Will get a physical therapy consult to assess his mobility in the morning. The patient is already followed by hospice at  home and has home health services.  2.  Hypertension: The patient is hemodynamically stable. Continue metoprolol.  3.  Hyperlipidemia: Continue with his simvastatin.  4. Benign prostatic hypertrophy: Continue Flomax.  5. Hypothyroidism: Continue Synthroid.  6.  Depression: Continue Celexa.  7.  Problems: Glaucoma. Continue timolol.  8.  Elevated troponin: This is likely the tail-end of his previous non-ST elevation myocardial infarction which he had in April 2014. His troponin at that time was 4. He does not have any acute electrocardiographic changes. No further work-up is necessary at this point.   THE PATIENT IS A DNI/DNR.   Time spent is fifty minutes.    ____________________________ Rolly PancakeVivek J. Cherlynn KaiserSainani, MD [id number] D: 01/01/2013 15:03:03 ET T: 01/01/2013 15:41:22 ET JOB#: [id number]  cc: Rolly PancakeVivek J. Cherlynn KaiserSainani, MD,  <Dictator> Houston SirenVIVEK J Shelton Soler MD ELECTRONICALLY SIGNED 01/02/2013 14:46

## 2014-09-21 NOTE — H&P (Signed)
DATE OF BIRTH:  Dec 10, 1922  DATE OF ADMISSION:  08/29/2012  PRIMARY CARE PHYSICIAN:  Dr. Julieanne Manson  CARDIOLOGIST:  Dr. Gwen Pounds  CHIEF COMPLAINT:  Altered mental status and chest pain.   HISTORY OF PRESENT ILLNESS:  This is an 81 year old male who presents to the hospital due to altered mental status, and also developed some chest pain last night. The patient himself has underlying dementia. Therefore, most of the history obtained from the wife at bedside. As per the wife, patient's mental status has been slowly declining over the past few days, and also in the past few weeks. The patient does have baseline underlying dementia, but it seems like it has been progressively getting worse. The patient apparently has had frequent falls in the past few days. He also had some chest pain last night that the patient's wife had to give the patient some nitroglycerin for. The patient received 2 nitroglycerin tablets, and the chest pain resolved shortly thereafter. She took the patient to see his primary care physician today, who referred the patient to the ER for further evaluation. In the Emergency Room, the patient was noted to be confused, and noted to have an elevated troponin at 5. He was also noted to be hyperglycemic and in mild acute renal failure. Hospitalist services were contacted for treatment and evaluation.   REVIEW OF SYSTEMS:  Otherwise unobtainable, given the patient's mental status.   PAST MEDICAL HISTORY:  Consistent with diabetes, hypertension, hyperlipidemia, history of coronary artery disease, status post coronary artery bypass graft surgery, chronic atrial fibrillation, peripheral vascular disease, hypothyroidism.   ALLERGIES:  He is allergic to PRINIVIL and AGGRENOX.   SOCIAL HISTORY:  No smoking. No alcohol abuse. No illicit drug abuse. Lives at home with his wife.   FAMILY HISTORY:  The patient's mother died from pancreatic cancer. Father died at age 33 from MI.     CURRENT MEDICATIONS ARE AS FOLLOWS:  Aspirin 81 mg daily. Tylenol Extended Release as needed. Eliquis 2.5 mg b.i.d.  Fluocinomide 0.05% topical cream to be applied to rash as needed.  Humulin NPH 20 to 24 units in the morning, 5 units in the evening. Synthroid 100 mcg daily. Lubricant eye drops daily. Metoprolol tartrate 12.5 mg b.i.d. Nitroglycerin sublingual as needed. Simvastatin 40 mg daily. Bactrim 1 tab b.i.d.   PHYSICAL EXAMINATION ON ADMISSION:  VITAL SIGNS:  Temperature is 98, pulse 92, respirations 18, blood pressure 168/70, sats 98% on room air.  GENERAL:  He is a confused male lying in bed, but in no apparent distress.  HEAD, EYES, EARS, NOSE THROAT EXAM: He is atraumatic, normocephalic. His extraocular muscles are intact. His pupils are equal, round, reactive to light. Sclerae are anicteric. No conjunctival injection. No pharyngeal erythema.  NECK:  Supple. There is no jugular venous distention. No bruits. No lymphadenopathy or thyromegaly.  HEART:  Regular rate and rhythm. No murmurs, rubs or clicks.  LUNGS:  Clear to auscultation bilaterally. No rales, no rhonchi, no wheezes.  ABDOMEN: Soft, flat, nontender, nondistended. Has good bowel sounds. No hepatosplenomegaly appreciated.  EXTREMITIES:  No evidence of any cyanosis, clubbing, or peripheral edema. Has +2 pedal and radial pulses bilaterally.  The patient does have a right toe dressing from a chronic diabetic wound. NEUROLOGICAL: The patient is alert, awake, oriented x1. Difficult to do a full neurological exam, but moves all extremities spontaneously.  SKIN EXAM:  Moist and warm, with no rashes appreciated.  LYMPHATIC:  There is no cervical or axillary lymphadenopathy.  LABORATORY EXAM:  Showed a serum glucose of 444, BUN 34, creatinine 1.4, sodium 133, potassium 4.7, chloride 99, bicarb 29. LFTs are within normal limits. Troponin 5.07. TSH of 5.2. Hemoglobin 10.7, hematocrit 32, white cell count 7.3, platelet count 215.    The patient did have a CT of the head done, which showed no evidence of any acute intracranial process.   ASSESSMENT AND PLAN:  An 81 year old male with a past medical history of coronary artery disease, status post CABG, history of chronic atrial fibrillation, dementia, peripheral vascular disease, diabetes, hyperlipidemia, who presents to the hospital with altered mental status and confusion, also noted to have a non-ST elevation myocardial infarction, given the elevated troponin and some chest pain last night.   1.  Altered mental status/confusion. I likely think this is related to the patient's underlying dementia progressively getting worse, although could be an underlying urinary tract infection, We will await a urinalysis and urine culture for now. The patient has been started on Bactrim by his primary care physician yesterday. I will await a urinalysis at this point. The patient's CT head is negative. I do not appreciate any evidence of either focal metabolic or neurologic abnormality. Follow mental status closely. The patient is high risk for sundowning. Therefore, will avoid benzodiazepines and probably use some antipsychotics for agitation if needed.   2.  Non-ST-elevation myocardial infarction. This is the likely diagnosis, given the patient's chest pain last night, and now an elevated troponin at 5. The patient is a high risk for aggressive intervention, given his dementia and age. The patient is currently chest pain-free and hemodynamically stable. Therefore, I will continue aspirin, continue beta blocker, continue statin. Will put the patient on some Lovenox for a day or two. Will get an echocardiogram. Will also get a Cardiology consult, discuss the case with Dr. Juliann Paresallwood, covering for Dr. Gwen PoundsKowalski.   3.  History of chronic atrial fibrillation. The patient is currently rate -controlled. I will continue Lopressor for rate control for now. The patient is on Eliquis at home, but is high risk  for fall. Therefore, will discontinue the Eliquis for now.   4.  Acute renal failure. This is likely due to dehydration and poor p.o. intake, and likely acute tubular necrosis. I will give the patient gentle IV fluids, and follow BUN and creatinine.   5.  Diabetes. Blood sugars are somewhat uncontrolled. I will continue NPH insulin for now. Place on sliding scale insulin coverage on top of that. Check a hemoglobin A1c.    6.  Hypothyroidism. Continue Synthroid.   7.  Hyperlipidemia. Continue simvastatin.   8.  History of frequent falls. Will get a Physical Therapy consult to assess her mobility.   The patient is a DNI/DNR.   Time spent on admission is 50 minutes.    ____________________________ Rolly PancakeVivek J. Cherlynn KaiserSainani, MD vjs:mr D: 08/29/2012 18:46:05 ET T: 08/29/2012 20:14:58 ET JOB#: [id number]  cc: Rolly PancakeVivek J. Cherlynn KaiserSainani, MD, <Dictator> Houston SirenVIVEK J SAINANI MD ELECTRONICALLY SIGNED 09/06/2012 13:44

## 2014-09-23 NOTE — H&P (Signed)
PATIENT NAME:  Benjamin Dominguez, Benjamin Dominguez MR#:  [id number] DATE OF BIRTH:  [birth year]  DATE OF ADMISSION:  09/10/2011  ADMITTING PHYSICIAN: Enid Baas, MD PRIMARY CARE PHYSICIAN: Richard L. Sullivan Lone, MD   CHIEF COMPLAINT: Acute confusion.   HISTORY OF PRESENT ILLNESS: Mr. Mcluckie is an 81 year old male with a past medical history significant for coronary artery disease, status post bypass graft surgery, ischemic cardiomyopathy, congestive heart failure, chronic atrial fibrillation on anticoagulation, chronic right foot osteomyelitis with MRSA  being treated palliatively, hypertension, diabetes, with history of several cerebrovascular accidents, multiple falls at home with recent neck fracture, C2 in a neck collar, was brought in from rehab secondary to acute confusion noticed by family today. The patient has been in the hospital from 09/06/2011 to 09/09/2011, was just discharged yesterday to rehab. According to the patient's wife and daughter at bedside, the patient was found to be confused this morning, agitated, restless and was seeing things which were not in the room. He does have occasional agitation which is baseline if he has to do something that he does not like, but definitely not hallucinations. So he was brought back to the hospital. He was on fentanyl patch placed for his neck fracture for pain, but that has been taken off yesterday, and he has been using only Tylenol p.r.n. for pain. No new medication changes were made. He is also noticed to be on hydroxyzine 50 mg every six hours p.r.n. for itching of his skin rash on his abdomen and also his thighs, which is a new medication for him, too. At  my current examination, the patient is oriented x3, and recognizing family members, and probably pretty close to his baseline. He is being admitted under observation for his acute delirium.   PAST MEDICAL HISTORY:  1. Coronary artery disease, status post bypass graft surgery.  2. Ischemic cardiomyopathy.   3. Congestive heart failure.  4. Chronic atrial fibrillation.  5. Chronic right great toe osteomyelitis with MRSA infection being treated palliatively.  6. Hypertension.  7. Hypercholesterolemia.  8. Peripheral vascular disease.  9. Diabetes mellitus.  10. History of recurrent cerebrovascular accidents.  11. Chronic right shoulder arthritis. 12. Left hip arthritis with limited motion.   PAST SURGICAL HISTORY:  1. Carpal tunnel surgery.  2. Coronary artery bypass graft surgery.  3. Traumatic left leg surgery.  4. Five eye surgeries, including laser surgery on the left eye three times.  5. Cataract surgery.   ALLERGIES: Aggrenox and Prinivil.    CURRENT MEDICATIONS:  1. Colace 100 mg p.o. b.i.d.  2. Eliquis 2.5 mg p.o. b.i.d.  3. Fluocinonide 0.05% topical cream apply b.i.d.  4. Lasix 20 mg p.o. b.i.d.  5. GlucaGen 1 mg injection for hypoglycemia for fingerstick blood glucose less than 60.  6. Hydroxyzine 50 mg p.o. every 6 hours p.r.n.  7. Synthroid 50 mcg p.o. daily.  8. Losartan 50 mg p.o. daily.  9. Magnesium oxide 400 mg p.o. daily.  10. Metoprolol 12.5 mg p.o. b.i.d.  11. MiraLax p.r.n. for constipation.  12. Nitrostat 0.4 mg sublingual p.r.n. for chest pain.  13. Novolin N 100 units/mL, 10 units subcutaneous in the evening.  14. Novolin N 100 units/mL, 25 units subcutaneous in the morning.  15. Novolin R sliding scale insulin.  16. Senna b.i.d.  17. Simvastatin 40 mg p.o. daily.  18. Bactrim Double Strength p.o. b.i.d.  19. Tylenol 650 mg every 4 hours p.r.n. for pain.   SOCIAL HISTORY: Currently from rehab. He used to live at  home with his wife. No smoking or alcohol use.   FAMILY HISTORY: Family history is significant for heart disease in the family.   REVIEW OF SYSTEMS: Review of systems is difficult to obtain secondary to his confusion.   PHYSICAL EXAMINATION:  VITAL SIGNS: Temperature 98.3 degrees Fahrenheit, pulse 91, respirations 18, blood pressure  127/63, pulse oximetry 93% on room air.   GENERAL: Well built, well nourished male lying in bed, not in any acute distress.   HEENT: Normocephalic, atraumatic. Pupils are equal, round, reacting to light. Anicteric sclerae. Extraocular movements are intact. Oropharynx is clear without erythema, mass or exudates.   NECK: Neck is in an immobilized collar secondary to his recent neck fracture.   LUNGS: Moving air bilaterally, clear to auscultation. Decreased bibasilar breath sounds and a few rhonchi, more prominent on the right base. No use of accessory muscles for breathing.   CARDIOVASCULAR: S1, S2, regular rate and rhythm. A 3 out of 6 systolic murmur. No rubs or gallops.   ABDOMEN: Soft, nontender, nondistended. No hepatosplenomegaly. Normal bowel sounds.   EXTREMITIES: No pedal edema. Right foot is wrapped in a bandage secondary to his chronic ulcer. Feeble dorsalis pedis pulses palpable bilaterally.   SKIN: He has a blistery erythematous rash on his abdomen and hips and also on the thighs which is chroni- para psoriasis secondary to diabetes according to family.   LYMPHATICS: No cervical lymphadenopathy.   NEUROLOGICAL: The patient is awake and following simple commands. No motor or sensory abnormalities are identified.   PSYCHOLOGICAL: He is awake, alert, oriented x3.   LABORATORY, DIAGNOSTIC AND RADIOLOGICAL DATA:  WBC 11.7, hemoglobin 9.3, hematocrit 27.6, platelet count 461. Sodium 132, potassium 4.8, chloride 97, bicarbonate 26, BUN 24, creatinine 1.02, glucose 248, and calcium 8.2.  ALT 30, AST 32, alkaline phosphatase 122, total bilirubin 0.5, and albumin of 1.7.  Urinalysis negative for any infection.  Urine toxicology screen negative.  CK and CK-MB, troponin within normal limits.  Chest x-ray showing diffuse thickening of interstitial lung markings compatible with edema or pneumonia. Stable cardiomegaly. Arthritic changes of right shoulder seen.  ASSESSMENT AND PLAN: The  patient is an 81 year old male with history of chronic right foot osteomyelitis-on Bactrim, hypertension, and diabetes, who was just discharged yesterday to rehab, brought back for acute delirium.   1. Acute delirium: Could be either from pneumonia or medication affect. Fentanyl patch was just taken off yesterday. Could be also from Atarax as the patient has been on higher doses for itching. It is a new medication for him. We will hold off on that. Mental status is now improving and close to baseline, so we will admit him under observation. I appreciate Palliative Care input, and he will probably be discharged home to [facility] with Hospice. So I will not order a PT consult at this time and will get Case Management consult for Hospice screening.  2. Pneumonia, bibasilar changes: Either edema or infiltrate, right greater than left. We will start Levaquin, send blood cultures. The patient is already on Bactrim.  3. Chronic right foot osteomyelitis: MRSA in cultures in the past. Palliative treatment with Bactrim lifelong treatment. Read Dr. Sharrell Ku note for more information.  4. Hypertension: Continue home medications. The patient is on losartan, low dose Lasix, and also metoprolol.  5. Atrial fibrillation: Rate control with low-dose metoprolol. He is on Eliquis per Dr. Philemon Kingdom recommendation from last admission for anticoagulation.  6. Diabetes mellitus: The patient is on Novolin N twice a day and also sliding  scale insulin.   CODE STATUS: DO NOT RESUSCITATE, from Palliative Care's note.   TIME SPENT ON ADMISSION: 50 minutes. ____________________________ Enid Baasadhika Yves Fodor, MD rk:cbb D: 09/10/2011 17:39:03 ET T: 09/10/2011 18:49:20 ET JOB#: [id number] cc: Enid Baasadhika Sarenity Ramaker, MD, <Dictator> Richard L. Sullivan LoneGilbert, MD Enid BaasADHIKA Kasim Mccorkle MD ELECTRONICALLY SIGNED 09/11/2011 14:29

## 2014-09-23 NOTE — Consult Note (Signed)
General Aspect ASO bilateral lower extremities with gangrene of the right foot.    Present Illness The patient is an 81 year old male followed by Dr. Rosanna Randy. He has chronic right lower extremity gangrene and was seen by surgery and felt he was not a surgical candidate for amputation.  This was a general surgery evaluation not a vascular surgeon or podiatrist.  He has been followed by the [facility] and has been on Septra DS.  He has significant heart disease with previous CABG, ischemic cardiomyopathy, and atrial fibrillation. He was recently taken off amiodarone and was placed on Eliquis with his Plavix. He was also given Duragesic for chronic pain. He presented to the emergency room with failure to thrive with worsening weakness and ataxia with frequent falls, also fever. CT of the C-spine revealed a nondisplaced fracture at C2. This was discussed with neurosurgery at [facility] who felt that this was nonoperative. The patient was to be placed in a C-collar for four weeks. Also, in the emergency room, the patient was noted to have an elevated troponin with an abnormal EKG. He is mildly hypoxic and is now admitted for further evaluation.  His right foot has two ulcers and evidence of cellulitis.  PAST MEDICAL HISTORY:  1. Atherosclerotic cardiovascular disease status post coronary artery bypass graft.  2. Ischemic cardiomyopathy.  3. Congestive heart failure.  4. Chronic atrial fibrillation.  5. Right lower extremity gangrene with osteomyelitis.  6. Benign hypertension.  7. Hyperlipidemia.  8. Peripheral vascular disease.  9. Glaucoma.  10. Type 2 diabetes mellitus, on insulin. 11. History of methicillin-resistant Staphylococcus aureus   Home Medications: Medication Instructions Status  Plavix 75 mg oral tablet 1  orally once a day  Active  Resume using Insulin NPH according to your sugars twice a day  Active  losartan 100 mg oral tablet 1/2 tablet orally once a day  Active  Humulin N  100 units/mL subcutaneous suspension 25 units subcutaneous once a day (in the morning)  Active  Humulin N 100 units/mL subcutaneous suspension 10 units subcutaneous once a day (in the evening)  Active  Lasix 20 mg oral tablet 1 tab(s) orally 2 times a day Active  hydrOXYzine hydrochloride 10 mg oral tablet 1 tab(s) orally every 4 to 6 hours Active  Septra 400 mg-80 mg oral tablet 1 tab(s) orally once a day Active  Eliquis 5 mg oral tablet 0.5 tab(s) orally 2 times a day Active  fluocinonide 0.05% topical cream 1 application topically once a day Active  Duragesic 12 mcg/hr transdermal every 3 days Active  levothyroxine 100 mcg (0.1 mg) oral capsule 1 cap(s) orally once a day Active  metoprolol succinate 25 mg oral tablet, extended release 0.5 tab(s) orally 2 times a day Active  simvastatin 40 mg oral tablet 1  orally once a day  Active    Prinivil: Unknown  Aggrenox: Unknown  Case History:   Family History Non-Contributory    Social History negative tobacco, negative ETOH, negative Illicit drugs   Review of Systems:   Fever/Chills Yes    Cough No    Sputum No    Abdominal Pain No    Diarrhea No    Constipation No    Nausea/Vomiting Yes    SOB/DOE Yes    Chest Pain No    Telemetry Reviewed Afib    Dysuria No    Tolerating PT No    Tolerating Diet Yes   Physical Exam:   GEN well developed, thin,  ill appearing    HEENT PERRL, hearing intact to voice, poor dentition    NECK trachea midline  c-collar present    RESP normal resp effort  no use of accessory muscles    CARD irregular rate  No LE edema    ABD denies tenderness  soft    EXTR negative cyanosis/clubbing, negative edema, ulcers present on the right great toe and the 5th met head; pulses not palpable    SKIN No rashes, No ulcers    NEURO follows commands, motor/sensory function intact    PSYCH alert, poor insight   Nursing/Ancillary Notes: **Vital Signs.:   08-Apr-13 10:00   Vital Signs Type  Routine   Temperature Temperature (F) 99.5   Celsius 37.5   Temperature Source oral   Pulse Pulse 92   Respirations Respirations 18   Systolic BP Systolic BP 545   Diastolic BP (mmHg) Diastolic BP (mmHg) 68   Mean BP 96   Pulse Ox % Pulse Ox % 94   Oxygen Delivery 2L   Routine Chem:  08-Apr-13 02:28    Glucose, Serum 222   BUN 20   Creatinine (comp) 1.03   Sodium, Serum 137   Potassium, Serum 4.5   Chloride, Serum 101   CO2, Serum 27   Calcium (Total), Serum 8.2   Osmolality (calc) 283   eGFR (African American) >60   eGFR (Non-African American) >60   Anion Gap 9  Routine Hem:  08-Apr-13 02:28    WBC (CBC) 9.8   RBC (CBC) 3.35   Hemoglobin (CBC) 9.7   Hematocrit (CBC) 29.2   Platelet Count (CBC) 477   MCV 87   MCH 28.9   MCHC 33.1   RDW 15.6  Cardiac:  08-Apr-13 02:28    Troponin I 0.83   CK, Total 53   CPK-MB, Serum 2.1  Routine Hem:  08-Apr-13 02:28    Neutrophil % 84.6   Lymphocyte % 6.3   Monocyte % 8.3   Eosinophil % 0.6   Basophil % 0.2   Neutrophil # 8.3   Lymphocyte # 0.6   Monocyte # 0.8   Eosinophil # 0.1   Basophil # 0.0  Lab:  08-Apr-13 04:00    O2 Saturation (Pulse Ox) 90   FiO2 (Pulse Ox) ra  Blood Glucose:  08-Apr-13 08:17    POCT Blood Glucose 255    16:34    POCT Blood Glucose 268    20:17    POCT Blood Glucose 145     Impression 1.  ASO bilateral lower extremities with gangrene         patient should have an angiogram with the possibility of intervention to salvage his foot.         I have reviewed Dr Bunnie Domino last not in the office from October 2012 and his plan is not what the patient and family are relating.  In fact it is quite the opposite.  This will be a significant hurdle to providing care for the patient. 2.  Gangrene and ulceration of the 1st and 5th toes         If tibial intervention were to be successful then the first toe and the 5th toes could be debrided under local or ankle block.  Once again the patient and his  family have been given misinformation by a general surgeon and are now quite adament that there are no surgical options.  Once again this will be a significant hurdle to providing care for  the patient. 3.  Ischemic cardiomyopathy          cardiology on consult. 4.  Atrial fibrillation          cardiology on consult 5.  C-spine fracture           conservatine therapy           collar for 4-6 weeks 6.  Hyperlipidemia            continue statin 7.  Diabetes             sliding scale insulin             [facility] following    Plan level 5   Electronic Signatures: Hortencia Pilar (MD)  (Signed 09-Apr-13 07:47)  Authored: General Aspect/Present Illness, Home Medications, Allergies, History and Physical Exam, Vital Signs, Labs, Impression/Plan   Last Updated: 09-Apr-13 07:47 by Hortencia Pilar (MD)

## 2014-09-23 NOTE — Discharge Summary (Signed)
PATIENT NAME:  Benjamin Dominguez, Yianni F MR#:  [id number] DATE OF BIRTH:  December 21, 1922  DATE OF ADMISSION:  09/10/2011 DATE OF DISCHARGE:  09/12/2011  ADMITTING PHYSICIAN: Dr. Enid Baasadhika Keanen Dohse DISCHARGING PHYSICIAN: Dr. Enid Baasadhika Jahmil Macleod   PRIMARY CARE PHYSICIAN: Dr. Julieanne Mansonichard Gilbert   CONSULTANTS IN THE HOSPITAL: Palliative care consultation by Dr. Harriett SineNancy Phifer.   DISCHARGE DIAGNOSES:  1. Acute delirium with periods of alertness and agitation.  2. Chronic osteomyelitis of the right foot with MRSA on suppressive therapy with Bactrim.  3. MRSA bacteremia from 09/06/2011. Repeat blood cultures this admission are negative.  4. Hypertension.  5. Diabetes mellitus with hypoglycemia.  6. Rash on admission on the abdomen, thighs, and hips, which is chronic secondary to diabetes.  7. Recent fall with C2 neck fracture in cervical collar. 8. Chronic atrial fibrillation, on Eliquis. 9. Hypothyroidism. 10. Coronary artery disease status post  bypass graft surgery. 11. Congestive heart failure, diastolic dysfunction. 12. History of recurrent strokes.   DISCHARGE MEDICATIONS:  1. Eliquis  2.5 mg p.o. twice a day.  2. Losartan 50 mg p.o. daily.  3. Nitrostat sublingual every five minutes p.r.n. for chest pain.  4. GlucaGen for hypoglycemia.  5. Metoprolol 12.5 mg p.o. b.i.d.  6. Tylenol 650 mg q. 4 h. p.r.n.  7. Aspirin 81 mg p.o. daily.  8. Fluocinonide 0.05% topical cream application once a day.  9. Levothyroxine 100 mcg p.o. daily.  10. MiraLAX powder p.r.n. for constipation.  11. Simvastatin 40 mg p.o. daily. 12. Eyedrops, one drop each eye b.i.d.  13. Timolol hemihydrate 0.5% ophthalmic solution, one drop to left eye once a day.  14. Novolin NPH insulin 10 units subcutaneous b.i.d.  15. Bactrim double strength, 1 tablet p.o. b.i.d. 16. Risperidone oral disintegrating tablet 0.5 mg p.o. q. 6 hours p.r.n. for agitation.  17. Ativan 0.5 mg p.o. q. 6 hours p.r.n. for agitation or anxiety.   18. Claritin 5 mg p.o. daily.  19. Roxanol 20 mg per mL, 5 to 10 mg p.o. sublingual q. 6 hours p.r.n. for pain.   DISCHARGE OXYGEN: None.   DISCHARGE DIET: As tolerated.  FOLLOWUP INSTRUCTIONS:  1. Primary care physician followup in 1 to 2 weeks.  2. Home with Hospice.   LABS AND IMAGING STUDIES: Blood cultures from 09/11/2011 negative so far. WBC 12.7, hemoglobin 9.7, hematocrit 30.4, platelet count 573. Sodium 132, potassium 4.6, chloride 97, bicarbonate 24, BUN 22, creatinine 1, magnesium of 1.9. Urinalysis negative for infection. Urine tox screen is negative. Chest x-ray on admission showing diffuse thickening of interstitial lung markings, bibasilar, compatible with pneumonia or edema. Stable cardiomegaly and arthritic changes of the right shoulder. Troponin is slightly elevated on admission at 0.7. Urine cultures are negative.   BRIEF HOSPITAL COURSE:   Benjamin Dominguez is an 81 year old Caucasian gentleman with past medical history significant for coronary artery disease status post bypass graft surgery, ischemic cardiomyopathy with congestive heart failure and chronic atrial fibrillation on anticoagulation, who was recently in the hospital from 09/06/2011 to 09/09/2011, just discharged recently to rehab and was brought back secondary to acute delirium.  1. Acute delirium: Likely secondary to underlying infection, osteomyelitis of his foot with MRSA bacteremia from last admission. He did receive one dose of vancomycin this admission and has been on chronic Bactrim suppression therapy. He does have other medical problems also which could contribute to this. Palliative care has seen the patient and, according to family he has been going downhill in spite of rehab over the past month. They  were considering Hospice and daughters strongly wanted to take the patient home with Hospice.  He was also placed on p.r.n. risperidone and Ativan along with Roxanol as his neck pain could be worsening his delirium,  too.  2. Chronic osteomyelitis of the left foot with MRSA infection and also MRSA bacteremia on last admission: The patient's one of two blood cultures were positive for MRSA from last admission and he does have chronic osteomyelitis of his heel not amenable for surgery and seen by Dr. Leavy Cella who put him on Bactrim lifelong treatment, more for palliation rather than  treatment of his wound as he will need surgery for sure for his chronic osteomyelitis. His blood cultures so far have been negative for 18 hours, but we explained to the family that he does have risk of MRSA bacteremia from his wound.  They do understand that, but since he is going home with Hospice they will not continue IV antibiotics. He was given one dose of vancomycin this admission.  3. Hypertension: His home medications were continued.  4. Diabetes mellitus with episodes of hypoglycemia secondary to poor p.o. intake: His insulin dosage has been decreased at the time of discharge.   He will be going to [facility] to live with his daughter and Hospice will be following him at home.  Wife also has expressed concern that if they cannot take care of him at home they probably would want him to be transferred to [facility] home in [place].   DISCHARGE CONDITION: Poor prognosis, very guarded.   DISCHARGE DISPOSITION: Home with Hospice.   TIME SPENT ON DISCHARGE: 40 minutes.   ____________________________ Enid Baas, MD rk:bjt D: 09/14/2011 15:20:03 ET T: 09/15/2011 12:15:55 ET JOB#: [id number]  cc: Enid Baas, MD, <Dictator> Richard L. Sullivan Lone, MD Enid Baas MD ELECTRONICALLY SIGNED 09/15/2011 15:07

## 2014-09-23 NOTE — H&P (Signed)
PATIENT NAME:  Benjamin Dominguez, Benjamin Dominguez MR#:  [id number] DATE OF BIRTH:  10-18-1922  DATE OF ADMISSION:  09/06/2011  REFERRING PHYSICIAN: Glennie Isle, MD  PRIMARY CARE PHYSICIAN: Julieanne Manson, MD  REASON FOR ADMISSION: Altered mental status with fever and elevated troponin. Also with chronic right lower extremity gangrene/osteomyelitis and known heart and peripheral vascular disease.   HISTORY OF PRESENT ILLNESS: The patient is an 81 year old male followed by Dr. Sullivan Lone. The patient was recently evaluated for his multiple medical problems. He has chronic right lower extremity gangrene and was seen by surgery and felt he was not a surgical candidate for amputation. He has been followed closely by the [facility] and has been on Septra DS for this. Also has significant heart disease with previous CABG, ischemic cardiomyopathy, and atrial fibrillation. He was recently taken off amiodarone and was placed on Eliquis with his Plavix. He was also given Duragesic for chronic pain. He presents to the emergency room after failure to thrive with worsening weakness and ataxia with frequent falls, also fever. In the emergency room, the patient was noted to be febrile. CT of the C-spine revealed a nondisplaced fracture at C2. This was discussed with neurosurgery at [facility] who felt that this was nonoperative. The patient was to be placed in a C-collar for four weeks. Also, in the emergency room, the patient was noted to have an elevated troponin with an abnormal EKG. He is mildly hypoxic and is now admitted for further evaluation.  PAST MEDICAL HISTORY:  1. Atherosclerotic cardiovascular disease status post coronary artery bypass graft.  2. Ischemic cardiomyopathy.  3. History of systolic congestive heart failure.  4. Chronic atrial fibrillation.  5. Right lower extremity gangrene with osteomyelitis.  6. Benign hypertension.  7. Hyperlipidemia.  8. Peripheral vascular disease.  9. Glaucoma.  10. Type 2  diabetes mellitus, on insulin. 11. History of methicillin-resistant Staphylococcus aureus.    MEDICATIONS:  1. Zocor 40 mg p.o. every p.m.  2. Senna one p.o. twice a day. 3. Plavix 75 mg p.o. daily.  4. Nitrostat p.r.n. chest pain.  5. Lopressor 12.5 mg p.o. twice a day. 6. Mag-Ox 400 mg p.o. daily.  7. Cozaar 50 mg p.o. daily.  8. Synthroid 0.05 mg p.o. daily.  9. Humulin N 25 units subcutaneous every a.m. and 10 units subcutaneous every p.m.  10. Duragesic 12 mcg topically every three days.  11. Lasix 20 mg p.o. twice a day.  12. Eliquis 2.5 mg p.o. twice a day. 13. Septra DS 1 p.o. daily.  14. Atarax p.r.n. itching.  15. Fluocinonide 0.05% cream applied daily.   ALLERGIES: Aggrenox and Prinivil.   SOCIAL HISTORY: The patient is married. No history of alcohol or tobacco abuse.   FAMILY HISTORY: Positive for coronary artery disease and hypertension.   REVIEW OF SYSTEMS: Unable to obtain from the patient.   PHYSICAL EXAMINATION:   GENERAL: The patient is chronically ill-appearing, in mild distress.   VITAL SIGNS: Vital signs are currently remarkable for a blood pressure of 150/76 with a heart rate of 83 and a respiratory rate of 18. Temperature is 99.2.   HEENT: Normocephalic, atraumatic. Pupils equally round and reactive to light and accommodation. Extraocular movements are intact. Sclerae anicteric. Conjunctivae are clear. Oropharynx is clear.   NECK: Supple without jugular venous distention or bruits. No adenopathy or thyromegaly is noted.   LUNGS: Decreased breath sounds at the bases. No wheezes or rhonchi. No rales.   CARDIAC: Irregularly irregular rhythm. There is a  2/6 systolic murmur noted. No rubs or gallops are present.   ABDOMEN: Soft and nontender with normoactive bowel sounds. No organomegaly or masses were appreciated. No hernias or bruits were noted.   EXTREMITIES: Stasis changes with 1+ edema. Dry right gangrene was noted to the right foot. Pulses were  trace bilaterally.   SKIN: Warm and dry without lesions. The right dry gangrene was noted on the right foot and he did have diffuse psoriasis from the waist down.   NEUROLOGIC: Cranial nerves II through XII grossly intact. Deep tendon reflexes were symmetric. Motor and sensory exam is nonfocal.   PSYCH: Unable to be performed.   LABS/STUDIES: EKG revealed sinus rhythm with a first degree AV block and a septal infarct with a right bundle branch block.   Head CT showed no acute intracranial process.   CT of the cervical spine revealed a nondisplaced fracture of the dens. U  Urinalysis was unremarkable.   Total CK was 49 with a MB of 1.2 and a troponin of 0.5. White count was 8.5 with a hemoglobin of 8.9. Glucose was 177 with a BUN of 25 and a creatinine 1.01 with a GFR of greater than 60.   ASSESSMENT:  1. Elevated troponin of unclear significance.  2. Abnormal EKG.  3. Fever.  4. Altered mental status.  5. Cervical spine fracture.  6. Right lower extremity gangrene with osteomyelitis.  7. Type 2 diabetes mellitus.  8. Anemia of chronic disease.   PLAN: The patient will be admitted to telemetry as a NO CODE BLUE, DO NOT RESUSCITATE, according to the family's wishes. We will send blood cultures at this time and begin IV Invanz with IV vancomycin and p.o. Septra DS for his gangrene and fever. In regards to his heart disease, we will consult cardiology. We will follow serial cardiac enzymes. We will continue his outpatient regimen for now. We will consult Infectious Disease and Vascular Surgery for the gangrene. The patient apparently had a recent bone scan which did reveal osteomyelitis. We will consult Wound Care for continued follow-up in regards to this wound as well. We will follow his sugars with Accu-Cheks before meals and at bedtime and add sliding scale insulin as needed. We will supplement oxygen at this time and wean as tolerated. We will consult Palliative Care because of the  patient's multiple medical issues and poor prognosis. Further treatment and evaluation will depend upon the patient's progress.   TOTAL TIME SPENT ON ADMISSION: 50 minutes.  ____________________________ Duane LopeJeffrey D. Judithann SheenSparks, MD jds:slb D: 09/06/2011 14:48:49 ET T: 09/06/2011 15:15:21 ET JOB#: [id number]  cc: Duane LopeJeffrey D. Judithann SheenSparks, MD, <Dictator> Richard L. Sullivan LoneGilbert, MD Idalys Konecny Rodena Medin Karagan Lehr MD ELECTRONICALLY SIGNED 09/06/2011 17:44

## 2014-09-23 NOTE — Discharge Summary (Signed)
PATIENT NAME:  Benjamin Dominguez, Benjamin Dominguez MR#:  [id number] DATE OF BIRTH:  1922/07/27  DATE OF ADMISSION:  09/06/2011 DATE OF DISCHARGE:  09/09/2011  PRESENTING COMPLAINT: Altered mental status with fever.   DISCHARGE DIAGNOSES:  1. Altered mental status, possible systemic antiinflammatory response syndrome from chronic osteomyelitis. No other source of infection identified.  2. Chronic osteomyelitis with bilateral peripheral vascular disease.  3. Suspect C2 fracture. The patient needs conservative management and neck collar for 4 weeks.  4. Type 2 diabetes with peripheral neuropathy.  5. Anemia of chronic disease.  6. Mild confusion suspected due to fentanyl patch, removed.  7. Chronic atrial fibrillation, currently in sinus rhythm, on anticoagulation with Eliquis.  8. Coronary artery disease, status post coronary artery bypass graft, on aspirin.  9. Hyperlipidemia.  10. Hypertension.   DISCHARGE MEDICATIONS:  1. Tylenol 650 mg every 4 hours p.r.n.  2. Simvastatin 40 mg daily.  3. Nitroglycerin 0.4 mg sublingual every 5 hours p.r.n.  4. Losartan 50 mg daily.  5. Synthroid 0.05 mg every 6:00 a.m.  6. Sliding scale insulin.  7. Metoprolol 12.5 mg twice a day. 8. Mag-Ox 400 mg p.o. daily.  9. Humulin N 25 units at breakfast and 10 units at supper.  10. Lasix 20 mg twice a day. 11. Septra DS 1 tablet twice a day for chronic suppression for his osteomyelitis, in the right foot.   12. Lidex 0.05% cream apply to affected area twice a day.  13. Eliquis 2.5 mg twice a day for atrial fibrillation.  14. Colace 100 mg twice a day.  15. MiraLax 17 grams p.o. daily.  16. Atarax 50 mg every 6 hours p.r.n.  17. Senokot 1 tablet p.o. twice a day.  CONDITION ON DISCHARGE: Fair. Vitals stable.   CODE STATUS: NO CODE, DO NOT RESUSCITATE.  DISCHARGE LABS/STUDIES: White count 9.8, hemoglobin and hematocrit 9.7 and 29.2, and platelet count 477. Creatinine 1.03, sodium 137, potassium 4.5, chloride 101, and  bicarbonate 27. Troponin 0.83/0.5.   Wound culture of right foot shows light growth of MRSA.   Urinalysis is negative for urinary tract infection.   White count on admission was 8.5.   Blood cultures negative/no growth.   CONSULTATIONS:  1. Claudie Fisherman, MD. 2. Palliative Care. 3. Wound Care.  4. Levora Dredge, MD - Vascular. 5. Festus Barren, MD - Vascular.  6. Orson Aloe, MD - Infectious Disease.   BRIEF SUMMARY OF HOSPITAL COURSE: Benjamin Dominguez is an 81 year old Caucasian gentleman was brought in with:  1. Altered mental status and fever: He was admitted with possible SIRS suspected from chronic osteomyelitis. The patient was initially started on broad-spectrum antibiotics with IV vancomycin and Invanz. The patient's fever subsided and he has not had any high-grade spikes since admission. He was seen by Dr. Leavy Cella who recommended since no other source of infection was identified to discontinue antibiotics and continue long-term suppression with p.o. Bactrim. The patient white count remained stable throughout the hospital stay. Blood cultures remained negative. His urine was negative for urinary tract infection. A chest x-ray did not show any evidence of pneumonia. The patient was also seen by the wound team and recommended to continue dressing changes, as per their recommendations.  2. Chronic osteomyelitis with bilateral PVD: He was seen by Dr. Levora Dredge and Dr. Wyn Quaker. The patient was also evaluated as an outpatient and is not a surgical candidate given multiple comorbidities. Chronic long-term suppression with p.o. Bactrim was recommended.  3. Suspected C2 fracture, status  post fall: [facility] Jordan [facility]ikesPool [facility] was contacted by Dr. Letitia LibraJohnston and recommended a neck collar for four weeks. The patient's family was given the telephone number and will follow up with Dr. Jordan LikesPool after four weeks of discharge. He is to continue his neck collar and avoid any jerks or any  neck exercises for now.  4. Chronic atrial fibrillation: The patient remained in sinus rhythm and was seen by Dr. Lady GaryFath who initially recommended aspirin and Plavix given his fall risk and bleeding risk. CT of the head on admission did not show any evidence of bleed. I discussed with Dr. Gwen PoundsKowalski, who is the patient's primary cardiologist, and he does recommend for the patient, since he has had a long-standing history of chronic atrial fibrillation, to be on Eliquis, which has shorter half-life and less risk of GI bleed, which Dr. Gwen PoundsKowalski will reevaluate in the near future if necessary to be stopped, but for now he wants Eliquis to be continued for anticoagulation with history of atrial fibrillation. We also discussed about coronary artery disease and peripheral vascular disease and an antiplatelet agent. Plavix was discontinued, however, a low dose baby aspirin was continued.  5. Hypothyroidism: On Synthroid.  6. Hyperlipidemia. On simvastatin.  7. Hypertension: Beta blockers and losartan were continued.  8. Mild confusion: Likely could be due to fentanyl patch, which was removed. The patient appears to be at baseline, per his primary care physician, Dr. Sullivan LoneGilbert, who visited the patient  during the hospital stay. The does not have underlying dementia, per Dr. Sullivan LoneGilbert.   DISPOSITION: The patient will be discharged to rehab once a bed is available. He is a DO NOT RESUSCITATE NO CODE. After discharge from rehab, he will be followed by hospice services at home. The above was discussed with the patient's primary care physician, Dr. Sullivan LoneGilbert,             and family members who are agreeable to it. He will followup again with Dr. Julio SicksHenry Pool, [facility], [facility]. The telephone number was provided.  TIME SPENT: 40 minutes.  ____________________________ Wylie HailSona A. Allena KatzPatel, MD sap:slb D: 09/09/2011 08:38:00 ET T: 09/09/2011 09:15:57 ET JOB#: [id number]  cc: Keeton Kassebaum A. Allena KatzPatel, MD,  <Dictator> Richard L. Sullivan LoneGilbert, MD Kathaleen MaserHenry A. Jordan LikesPool, MD Lamar BlinksBruce J. Kowalski, MD Renford DillsGregory G. Schnier, MD Rosalyn GessMichael E. Blocker, MD Willow OraSONA A Riverlyn Kizziah MD ELECTRONICALLY SIGNED 09/10/2011 14:30

## 2014-09-23 NOTE — Consult Note (Signed)
Brief Consult Note: Diagnosis: ASO bilateral lower extremities with gangrene and osteomyelitis of the right forefoot.   Patient was seen by consultant.   Comments: Will review office noninvasive studies based on my discussion with the family and their interpretation of recommendations made by Dr Wyn Quakerew and Dr Egbert GaribaldiBird treatment options are somewhat limitted.  Electronic Signatures: Levora DredgeSchnier, Teige Rountree (MD)  (Signed 08-Apr-13 09:03)  Authored: Brief Consult Note   Last Updated: 08-Apr-13 09:03 by Levora DredgeSchnier, Kinslei Labine (MD)

## 2014-09-23 NOTE — Consult Note (Signed)
General Aspect the patient is an 81 year old male with history of type 1 diabetes mellitus which has been present since he was 81 years of age. He also has a history of coronary artery disease status post coronary artery bypass grafting in 1998 with 4 grafts. He has a history of peripheral vascular disease, mild aortic stenosis, hypertension and hyperlipidemia. He also has had a persistent wound on his right lower extremity which has developed methicillin-resistant staph aureus osteomyelitis. It is been difficult to treat medically. He has been seen by general and vascular surgery and does not appear to be a candidate for amputation or further revascularization do to his underlying medical condition. He has atrial fibrillation and had been on amiodarone however this had been recently discontinued do to persistent atrial fibrillation and side effects of the drug. He was recently placed on Apixaban for chronic anticoagulation and taken off of aspirin. He was continued on Plavix. He has had no obvious source of bleeding since that time however he has family states that he has had several falls in the last several months causing head trauma. There is been no evidence of intracranial bleed by CT scan during this admission. Patient's mental status is altered and therefore not able to give history. History was obtained from the patient's wife, daughter and primary care provider. He currently in service questions for the most part appropriately. He denies any chest pain or shortness of breath currently. He did have a mild serum troponin elevation of 0.83. This appears to be secondary to increased demand.  the patient has a hard neck brace in place.   Physical Exam:   GEN disheveled    HEENT PERRL    NECK not able to assess secondary to neck brace    RESP rhonchi    CARD Irregular rate and rhythm  Murmur    Murmur Systolic    Systolic Murmur Out flow    ABD denies tenderness  normal BS    LYMPH  negative neck    EXTR negative cyanosis/clubbing    SKIN normal to palpation    NEURO cranial nerves intact, motor/sensory function intact    PSYCH poor insight, lethargic   Review of Systems:   Subjective/Chief Complaint weakness, fatigueand lower extremity discomfort    General: Fatigue  Weakness    Respiratory: No Complaints    Cardiovascular: No Complaints    Gastrointestinal: No Complaints    Musculoskeletal: Muscle or joint pain    Hematologic: No Complaints    Endocrine: No Complaints    Psychiatric: No Complaints    Review of Systems: All other systems were reviewed and found to be negative    Medications/Allergies Reviewed Medications/Allergies reviewed     Heart attack: one year ago & eight years ago, 07-Sep-2011   Tripple Bipass Surgery: Tripple bipass surgery 24 years ago   shingles:    Gangreen - Dry: unhealing for over one year, 04-Sep-2011   Multi-drug Resistant Organism (MDRO): Positive culture for MRSA., 09-Dec-2010   Diabetes Mellitus,Type I (IDD):   Home Medications: Medication Instructions Status  simvastatin 40 mg oral tablet 1  orally once a day  Active  Plavix 75 mg oral tablet 1  orally once a day  Active  Resume using Insulin NPH according to your sugars twice a day  Active  losartan 100 mg oral tablet 1/2 tablet orally once a day  Active  Humulin N 100 units/mL subcutaneous suspension 25 units subcutaneous once a day (in the morning)  Active  Humulin N 100 units/mL subcutaneous suspension 10 units subcutaneous once a day (in the evening)  Active  Lasix 20 mg oral tablet 1 tab(s) orally 2 times a day Active  hydrOXYzine hydrochloride 10 mg oral tablet 1 tab(s) orally every 4 to 6 hours Active  Septra 400 mg-80 mg oral tablet 1 tab(s) orally once a day Active  Eliquis 5 mg oral tablet 0.5 tab(s) orally 2 times a day Active  fluocinonide 0.05% topical cream 1 application topically once a day Active  Duragesic 12 mcg/hr transdermal  every 3 days Active  levothyroxine 100 mcg (0.1 mg) oral capsule 1 cap(s) orally once a day Active  metoprolol succinate 25 mg oral tablet, extended release 0.5 tab(s) orally 2 times a day Active   EKG:   Interpretation atrial fibrillation    Prinivil: Unknown  Aggrenox: Unknown    Impression 81 year old male with history of coronary artery disease and chronic atrial fibrillation currently treated with Apixaban in addition to Plavix. He has had a number of falls and x-ray has a hard neck collar in place do to a recent fall causing head and neck trauma. He has no active bleeding and head CT did not reveal any subdural hematoma or any intracranial bleeding or other pathology. He has a difficult to treat lower extremity gangrenous leg. This is methicillin resistant staph aureus is not responding well to IV therapy. He does not appear to be a candidate for surgical intervention of his lower sternum he either with amputation or revascularization. I discussed at length with the patient's wife, daughter and primary care provider further anti-coagulation therapy. He is at somewhat relative risk for intracranial hemorrhage secondary to his frequent falls on dual therapy. Consideration for discontinuation of Apixaban and continuing with either aspirin or Plavix may need to be discussed prior to discharge. Patient has apparently declined from a mental status and functional status standpoint. Would agree with palliative care evaluation.    Plan 1. Continue with current medications including Apixaban and Plavix however would discuss further treatment with these agents prior to discharge pending the patient's ambulatory ability and discharge disposition 2. Patient's elevated serum troponin appear to be secondary to increased demand in the face of coronary artery disease. Given his comorbid condition, he does not appear to be a candidate for invasive evaluation 3. Low-sodium diet 4. Continue with palliative care  evaluation 5 we'll discuss with the patient's primary cardiologist, Dr. Gwen PoundsKowalski.   Electronic Signatures: Dalia HeadingFath, Kenneth A (MD)  (Signed 08-Apr-13 22:10)  Authored: General Aspect/Present Illness, History and Physical Exam, Review of System, Past Medical History, Home Medications, EKG , Allergies, Impression/Plan   Last Updated: 08-Apr-13 22:10 by Dalia HeadingFath, Kenneth A (MD)

## 2014-09-23 NOTE — Consult Note (Signed)
PATIENT NAME:  Benjamin Dominguez, Benjamin Dominguez MR#:  [id number] DATE OF BIRTH:  03-Apr-1923  DATE OF CONSULTATION:  09/08/2011  DAILY PROGRESS NOTE  The patient is somewhat agitated this morning, but no evidence of chest pain or shortness of breath.   Physical exam unchanged from yesterday. The patient has had orders reviewed and updated. Radiology reviewed. Vital signs reviewed. Laboratory results reviewed.  The patient does have a syncope, known coronary artery disease, hypertension, hyperlipidemia, atrial fibrillation with recurrent episodes of fall with a fracture of his neck but no evidence of myocardial infarction or evidence of rhythm disturbances by telemetry. The patient should continue anticoagulation for risk reduction and stroke. He should have further treatment and rehabilitation for unsteadiness. The patient should have hypertension controlled with current medical regimen and Plavix for peripheral vascular disease and may be able to be discharged to home with a close followup and adjustments of medications.    ____________________________ Lamar BlinksBruce J. Kowalski, MD [id number] D: 09/09/2011 18:34:35 ET T: 09/10/2011 12:29:51 ET JOB#: [id number]  cc: Lamar BlinksBruce J. Kowalski, MD, <Dictator> Lamar BlinksBRUCE J KOWALSKI MD ELECTRONICALLY SIGNED 09/10/2011 13:18

## 2014-09-23 NOTE — Consult Note (Signed)
PATIENT NAME:  Benjamin BlossomCARGILE, Roald F MR#:  [id number] DATE OF BIRTH:  25-Jul-1922  DATE OF CONSULTATION:  09/07/2011  REFERRING PHYSICIAN:   Enedina FinnerSona Patel, MD CONSULTING PHYSICIAN:  Rosalyn GessMichael E. Laurielle Selmon, MD  REASON FOR CONSULTATION: Chronic osteomyelitis of the foot.   HISTORY OF PRESENT ILLNESS: The patient is an 81 year old white man with a past history significant for diabetes, peripheral vascular disease, coronary artery disease status post coronary artery bypass graft, stroke, congestive heart failure, and chronic methicillin-resistant Staphylococcus aureus osteomyelitis of the right great toe, who was admitted on 04/07 with mental status changes and falls. The patient's wife states that he has had several falls recently. He had been evaluated as an outpatient and was found to have a cervical fracture that was nonoperable. He was placed in a cervical neck collar. He has been on pain medications and this appears to have made him somewhat confused, although his wife says that he had been having some confusion even without the pain medicines in the past. He has had some low-grade fever up to 100.3 per his wife at home as well. His right great toe has had some diabetic foot ulcers present for approximately a year. He has been followed by Dr[facility]. He has been evaluated by vascular surgery who, per his wife, felt that his macrovascular system was okay but his microvascular system was affected by diabetes and there was no intervention possible. He had been evaluated by Dr. Egbert GaribaldiBird of surgery who felt that below-knee amputation would be risky given his cardiac disease and the need to stop his anticoagulation, and a decision was made not to pursue below-knee amputation. The patient has been following a palliative course with chronic trimethoprim sulfamethoxazole oral suppression provided by Dr. Thelma Bargeaks. The patient has done fairly well and Dr. Thelma Bargeaks, who saw the patient today, felt that the wounds were looking  fairly good. His wife states that they are the best they have looked in quite a long time. He is currently on trimethoprim sulfamethoxazole orally, ertapenem IV, and vancomycin IV. The patient was intermittently confused but at times was able to speak coherently. He has some difficulty with speech because of his prior CVAs. The full history was obtained mainly from the wife, however.   ALLERGIES: Aggrenox and Prinivil.   PAST MEDICAL HISTORY:  1. Coronary artery disease status post coronary artery bypass graft.  2. Ischemic cardiomyopathy.  3. He had congestive heart failure.  4. Chronic atrial fibrillation.  5. Right great toe osteomyelitis with MRSA being treated palliatively.  6. Hypertension.  7. Hypercholesterolemia.  8. Peripheral vascular disease.   9. Diabetes mellitus.  10. Recurrent cerebrovascular accidents.   SOCIAL HISTORY: The patient is married and lives with his wife. He does not smoke nor does he drink.   FAMILY HISTORY: Positive for coronary artery disease and hypertension.   REVIEW OF SYSTEMS: Unable to obtain from the patient.   PHYSICAL EXAMINATION:  VITAL SIGNS: T-max 99.5, T-current 99.4, pulse 100, blood pressure 151/76, 94% on 3 liters.   GENERAL:  81 year old white man in no acute distress.   HEENT: Normocephalic, atraumatic. Pupils equal and reactive to light. Extraocular motion intact. Sclerae, conjunctivae, and lids are without evidence for emboli or petechiae. Oropharynx  was difficult to examine as he was in a hard neck collar. Lips and gums appeared to be within normal limits.   NECK: Not evaluated due to the hard cervical neck collar.   LUNGS: Clear to auscultation bilaterally with  good air movement. No focal consolidation.   HEART: Regular rate and rhythm without murmur, rub, or gallop.   ABDOMEN: Soft, nontender, and nondistended. No hepatosplenomegaly. No hernias noted.   EXTREMITIES: No evidence for tenosynovitis.   SKIN: His right foot was  wrapped in bandages and was not directly observed. There was no evidence for lymphangitic streaking. There was no swelling bilaterally. He had no other ulcerations that were appreciated. There were no stigmata of endocarditis, specifically no Janeway lesions or Osler nodes.   NEUROLOGIC: The patient was in a hard neck collar and had some limited range of motion of his upper extremities. He was moving his lower extremities but full range of motion was not attempted.   PSYCHIATRIC: Mood and affect were difficult to address given his confusion at times.   LABORATORY DATA: BUN 20, creatinine 1.03, bicarbonate 27, anion gap 9, white count 9.8 with a hemoglobin of 9.7, platelet count of 477, ANC 8.3. White count on admission was 8.5. A wound culture of the right foot is growing gram-positive cocci. Urinalysis was unremarkable. A CT of the cervical spine without contrast showed a nondisplaced fracture of the body of the dens. There was mild anterolisthesis of  C3 on C4. A CT of the head without contrast showed no acute intracranial process. There were chronic small vessel changes present.   IMPRESSION: This is an 81 year old white man with a history of diabetes, peripheral vascular disease, coronary artery disease status post coronary artery bypass graft, stroke, congestive heart failure, and chronic methicillin-resistant Staphylococcus aureus osteomyelitis who was admitted with falls and mental status changes with low-grade fever.   RECOMMENDATIONS:  1. His chronic osteomyelitis is being followed by the [facility] using a palliative approach. He has been seen by vascular surgery who did not feel that revascularization was an option. He was seen by general surgery who did not want to perform below-knee amputation due to his operative risk. He has been maintained on suppressive trimethoprim sulfamethoxazole. The nature of palliation is to control and not eradicate infection. Per Dr. Thelma Barge the wounds themselves  look good today.  2. Would stop his IV therapy.  3. Would continue trimethoprim sulfamethoxazole for lifelong suppression.  4. He is to be discharged with Hospice services.  5. I would expect that his osteomyelitis may worsen over time and would not plan on any more aggressive therapy than oral suppression.  6. Continue contact isolation while in house.  7. We will sign off. Please feel free to call contact me with any further questions.       This is a moderately complex infectious disease case.    ____________________________ Rosalyn Gess. Americus Scheurich, MD [id number] D: 09/07/2011 16:27:35 ET T: 09/08/2011 07:57:20 ET JOB#: [id number]  cc: Rosalyn Gess. Congetta Odriscoll, MD, <Dictator> Luster Hechler E Nason Conradt MD ELECTRONICALLY SIGNED 09/08/2011 14:44

## 2014-09-23 NOTE — Consult Note (Signed)
Impression: 81yo WM w/ h/o DM, PVD, CAD, s/p CABG, CVA, CHF, chronic Methacillin Resistant Staph aureus osteomyelitis who was admitted with falls and MS changes with low grade fever.  His chronic osteomyelitis is being followed by the [facility] using a palliative approach.  He had been seen by vascular surgery who did not feel that revascularization was an option.  He was seen by general surgery who did not want to perform BKA due to his operative risk.  He has been maintained on suppressive TMP/SMX.  The nature of palliation is to control, not eradicate infection.  Per Dr. Inez Catalinaakes, the wounds look good. Will stop his IV therapy. Continue TMP/SMX for lifelong suppression. He is to be discharged with hospice services.   Would expect that his osteomyelitis may worsen over time.  Would not plan on any more aggresive therapy than oral suppression. Continue contact isolation. 7) Will sign off.  Please call with questions.    Electronic Signatures: Denver Harder, Rosalyn GessMichael E (MD) (Signed on 08-Apr-13 16:04)  Authored   Last Updated: 08-Apr-13 16:27 by Leonid Manus, Rosalyn GessMichael E (MD)
